# Patient Record
Sex: Female | Born: 1970 | Race: White | Hispanic: No | Marital: Married | Smoking: Never smoker
Health system: Southern US, Community
[De-identification: ages and names within clinical notes are randomized; demographics above are authoritative.]

## PROBLEM LIST (undated history)

## (undated) DIAGNOSIS — E785 Hyperlipidemia, unspecified: Secondary | ICD-10-CM

## (undated) DIAGNOSIS — E669 Obesity, unspecified: Secondary | ICD-10-CM

## (undated) DIAGNOSIS — M1990 Unspecified osteoarthritis, unspecified site: Secondary | ICD-10-CM

## (undated) DIAGNOSIS — I739 Peripheral vascular disease, unspecified: Secondary | ICD-10-CM

## (undated) DIAGNOSIS — F419 Anxiety disorder, unspecified: Secondary | ICD-10-CM

## (undated) HISTORY — DX: Obesity, unspecified: E66.9

## (undated) HISTORY — PX: COLONOSCOPY: SHX174

## (undated) HISTORY — DX: Unspecified osteoarthritis, unspecified site: M19.90

## (undated) HISTORY — DX: Anxiety disorder, unspecified: F41.9

## (undated) HISTORY — DX: Peripheral vascular disease, unspecified: I73.9

## (undated) HISTORY — PX: CHOLECYSTECTOMY: SHX55

---

## 1999-04-22 HISTORY — PX: CHOLECYSTECTOMY: SHX55

## 2001-04-19 ENCOUNTER — Encounter: Payer: Self-pay | Admitting: Emergency Medicine

## 2001-04-19 ENCOUNTER — Emergency Department (HOSPITAL_COMMUNITY): Admission: EM | Admit: 2001-04-19 | Discharge: 2001-04-19 | Payer: Self-pay | Admitting: Emergency Medicine

## 2006-05-07 ENCOUNTER — Ambulatory Visit: Payer: Self-pay

## 2007-04-08 ENCOUNTER — Other Ambulatory Visit: Admission: RE | Admit: 2007-04-08 | Discharge: 2007-04-08 | Payer: Self-pay | Admitting: Gynecology

## 2007-05-04 ENCOUNTER — Ambulatory Visit (HOSPITAL_COMMUNITY): Admission: RE | Admit: 2007-05-04 | Discharge: 2007-05-04 | Payer: Self-pay | Admitting: Gynecology

## 2008-01-12 ENCOUNTER — Ambulatory Visit (HOSPITAL_COMMUNITY): Admission: RE | Admit: 2008-01-12 | Discharge: 2008-01-12 | Payer: Self-pay | Admitting: Obstetrics and Gynecology

## 2008-05-24 ENCOUNTER — Inpatient Hospital Stay (HOSPITAL_COMMUNITY): Admission: AD | Admit: 2008-05-24 | Discharge: 2008-05-24 | Payer: Self-pay | Admitting: Obstetrics and Gynecology

## 2008-05-26 ENCOUNTER — Ambulatory Visit: Payer: Self-pay | Admitting: Obstetrics & Gynecology

## 2008-05-26 ENCOUNTER — Inpatient Hospital Stay (HOSPITAL_COMMUNITY): Admission: AD | Admit: 2008-05-26 | Discharge: 2008-06-01 | Payer: Self-pay | Admitting: Obstetrics & Gynecology

## 2008-06-02 ENCOUNTER — Inpatient Hospital Stay (HOSPITAL_COMMUNITY): Admission: AD | Admit: 2008-06-02 | Discharge: 2008-06-02 | Payer: Self-pay | Admitting: Obstetrics & Gynecology

## 2009-05-30 ENCOUNTER — Ambulatory Visit (HOSPITAL_COMMUNITY): Admission: RE | Admit: 2009-05-30 | Discharge: 2009-05-30 | Payer: Self-pay | Admitting: Obstetrics and Gynecology

## 2009-09-25 ENCOUNTER — Inpatient Hospital Stay (HOSPITAL_COMMUNITY): Admission: RE | Admit: 2009-09-25 | Discharge: 2009-09-28 | Payer: Self-pay | Admitting: Obstetrics & Gynecology

## 2009-09-25 ENCOUNTER — Encounter (INDEPENDENT_AMBULATORY_CARE_PROVIDER_SITE_OTHER): Payer: Self-pay | Admitting: Obstetrics & Gynecology

## 2009-10-29 ENCOUNTER — Encounter: Admission: RE | Admit: 2009-10-29 | Discharge: 2009-10-29 | Payer: Self-pay | Admitting: Obstetrics & Gynecology

## 2010-07-08 LAB — CCBB MATERNAL DONOR DRAW

## 2010-07-08 LAB — CBC
HCT: 38.3 % (ref 36.0–46.0)
MCHC: 35.2 g/dL (ref 30.0–36.0)
MCHC: 35.3 g/dL (ref 30.0–36.0)
Platelets: 187 10*3/uL (ref 150–400)
Platelets: 214 10*3/uL (ref 150–400)
RBC: 3.72 MIL/uL — ABNORMAL LOW (ref 3.87–5.11)
RDW: 14 % (ref 11.5–15.5)

## 2010-08-06 LAB — COMPREHENSIVE METABOLIC PANEL
ALT: 19 U/L (ref 0–35)
AST: 21 U/L (ref 0–37)
AST: 22 U/L (ref 0–37)
AST: 25 U/L (ref 0–37)
AST: 26 U/L (ref 0–37)
Albumin: 2.3 g/dL — ABNORMAL LOW (ref 3.5–5.2)
Albumin: 2.3 g/dL — ABNORMAL LOW (ref 3.5–5.2)
Albumin: 2.3 g/dL — ABNORMAL LOW (ref 3.5–5.2)
Albumin: 2.4 g/dL — ABNORMAL LOW (ref 3.5–5.2)
Albumin: 2.8 g/dL — ABNORMAL LOW (ref 3.5–5.2)
Albumin: 2.8 g/dL — ABNORMAL LOW (ref 3.5–5.2)
Alkaline Phosphatase: 119 U/L — ABNORMAL HIGH (ref 39–117)
Alkaline Phosphatase: 86 U/L (ref 39–117)
Alkaline Phosphatase: 86 U/L (ref 39–117)
BUN: 11 mg/dL (ref 6–23)
BUN: 14 mg/dL (ref 6–23)
BUN: 9 mg/dL (ref 6–23)
BUN: 9 mg/dL (ref 6–23)
CO2: 25 mEq/L (ref 19–32)
CO2: 26 mEq/L (ref 19–32)
Calcium: 9 mg/dL (ref 8.4–10.5)
Chloride: 104 mEq/L (ref 96–112)
Chloride: 104 mEq/L (ref 96–112)
Chloride: 105 mEq/L (ref 96–112)
Chloride: 107 mEq/L (ref 96–112)
Chloride: 107 mEq/L (ref 96–112)
Creatinine, Ser: 1.28 mg/dL — ABNORMAL HIGH (ref 0.4–1.2)
Creatinine, Ser: 1.36 mg/dL — ABNORMAL HIGH (ref 0.4–1.2)
GFR calc Af Amer: 55 mL/min — ABNORMAL LOW (ref >60)
GFR calc Af Amer: 57 mL/min — ABNORMAL LOW (ref >60)
GFR calc Af Amer: >60 mL/min (ref >60)
GFR calc non Af Amer: 44 mL/min — ABNORMAL LOW (ref >60)
GFR calc non Af Amer: 46 mL/min — ABNORMAL LOW (ref >60)
Glucose, Bld: 103 mg/dL — ABNORMAL HIGH (ref 70–99)
Glucose, Bld: 88 mg/dL (ref 70–99)
Potassium: 3.8 mEq/L (ref 3.5–5.1)
Potassium: 3.8 mEq/L (ref 3.5–5.1)
Potassium: 4 mEq/L (ref 3.5–5.1)
Potassium: 4 mEq/L (ref 3.5–5.1)
Potassium: 4.1 mEq/L (ref 3.5–5.1)
Sodium: 134 mEq/L — ABNORMAL LOW (ref 135–145)
Sodium: 138 mEq/L (ref 135–145)
Total Bilirubin: 0.2 mg/dL — ABNORMAL LOW (ref 0.3–1.2)
Total Bilirubin: 0.4 mg/dL (ref 0.3–1.2)
Total Bilirubin: 0.4 mg/dL (ref 0.3–1.2)
Total Bilirubin: 0.4 mg/dL (ref 0.3–1.2)
Total Bilirubin: 0.4 mg/dL (ref 0.3–1.2)
Total Protein: 5.5 g/dL — ABNORMAL LOW (ref 6.0–8.3)
Total Protein: 6.2 g/dL (ref 6.0–8.3)
Total Protein: 6.3 g/dL (ref 6.0–8.3)

## 2010-08-06 LAB — CBC
HCT: 30.4 % — ABNORMAL LOW (ref 36.0–46.0)
HCT: 38.3 % (ref 36.0–46.0)
HCT: 39.7 % (ref 36.0–46.0)
Hemoglobin: 11 g/dL — ABNORMAL LOW (ref 12.0–15.0)
Hemoglobin: 12.8 g/dL (ref 12.0–15.0)
Hemoglobin: 12.9 g/dL (ref 12.0–15.0)
MCHC: 33.7 g/dL (ref 30.0–36.0)
MCHC: 33.8 g/dL (ref 30.0–36.0)
MCHC: 34.4 g/dL (ref 30.0–36.0)
MCV: 87.1 fL (ref 78.0–100.0)
MCV: 88.2 fL (ref 78.0–100.0)
Platelets: 238 10*3/uL (ref 150–400)
Platelets: 243 10*3/uL (ref 150–400)
Platelets: 265 10*3/uL (ref 150–400)
Platelets: 266 10*3/uL (ref 150–400)
Platelets: 288 10*3/uL (ref 150–400)
RBC: 3.69 MIL/uL — ABNORMAL LOW (ref 3.87–5.11)
RBC: 4.38 MIL/uL (ref 3.87–5.11)
RDW: 14.4 % (ref 11.5–15.5)
RDW: 14.7 % (ref 11.5–15.5)
WBC: 10.3 10*3/uL (ref 4.0–10.5)
WBC: 10.6 10*3/uL — ABNORMAL HIGH (ref 4.0–10.5)
WBC: 10.8 10*3/uL — ABNORMAL HIGH (ref 4.0–10.5)
WBC: 10.8 10*3/uL — ABNORMAL HIGH (ref 4.0–10.5)
WBC: 12 10*3/uL — ABNORMAL HIGH (ref 4.0–10.5)
WBC: 14.7 10*3/uL — ABNORMAL HIGH (ref 4.0–10.5)

## 2010-08-06 LAB — BASIC METABOLIC PANEL
BUN: 10 mg/dL (ref 6–23)
CO2: 26 mEq/L (ref 19–32)
CO2: 26 mEq/L (ref 19–32)
Chloride: 103 mEq/L (ref 96–112)
Chloride: 104 mEq/L (ref 96–112)
Creatinine, Ser: 0.77 mg/dL (ref 0.4–1.2)
Creatinine, Ser: 1.15 mg/dL (ref 0.4–1.2)
GFR calc Af Amer: >60 mL/min (ref >60)
Potassium: 4.1 mEq/L (ref 3.5–5.1)
Potassium: 4.5 mEq/L (ref 3.5–5.1)
Sodium: 133 mEq/L — ABNORMAL LOW (ref 135–145)

## 2010-08-06 LAB — URINE MICROSCOPIC-ADD ON

## 2010-08-06 LAB — DIFFERENTIAL
Basophils Absolute: 0 10*3/uL (ref 0.0–0.1)
Basophils Relative: 0 % (ref 0–1)
Eosinophils Absolute: 0.2 10*3/uL (ref 0.0–0.7)
Eosinophils Relative: 2 % (ref 0–5)

## 2010-08-06 LAB — URINALYSIS, ROUTINE W REFLEX MICROSCOPIC
Bilirubin Urine: NEGATIVE
Glucose, UA: NEGATIVE mg/dL
Ketones, ur: NEGATIVE mg/dL
Ketones, ur: NEGATIVE mg/dL
Leukocytes, UA: NEGATIVE
Nitrite: NEGATIVE
Nitrite: NEGATIVE
Specific Gravity, Urine: 1.015 (ref 1.005–1.030)
Specific Gravity, Urine: 1.02 (ref 1.005–1.030)
Specific Gravity, Urine: >1.03 — ABNORMAL HIGH (ref 1.005–1.030)
Urobilinogen, UA: 0.2 mg/dL (ref 0.0–1.0)
Urobilinogen, UA: 0.2 mg/dL (ref 0.0–1.0)
pH: 6 (ref 5.0–8.0)
pH: 6.5 (ref 5.0–8.0)
pH: 6.5 (ref 5.0–8.0)

## 2010-08-06 LAB — URIC ACID
Uric Acid, Serum: 6 mg/dL (ref 2.4–7.0)
Uric Acid, Serum: 7.9 mg/dL — ABNORMAL HIGH (ref 2.4–7.0)

## 2010-08-06 LAB — LACTATE DEHYDROGENASE
LDH: 152 U/L (ref 94–250)
LDH: 234 U/L (ref 94–250)

## 2010-08-06 LAB — CCBB MATERNAL DONOR DRAW

## 2010-08-06 LAB — RPR: RPR Ser Ql: NONREACTIVE

## 2010-09-03 NOTE — Op Note (Signed)
Donna Bradley, WILTON               ACCOUNT NO.:  [id number]   MEDICAL RECORD NO.:  [id number]          PATIENT TYPE:  INP   LOCATION:  9102                          FACILITY:  [facility]   PHYSICIAN:  Ilda Mori, M.D.   DATE OF BIRTH:  May 17, 1970   DATE OF PROCEDURE:  05/27/2008  DATE OF DISCHARGE:                               OPERATIVE REPORT   PREOPERATIVE DIAGNOSIS:  Failure to progress.   POSTOPERATIVE DIAGNOSIS:  Failure to progress, occiput posterior.   PROCEDURE:  Primary low transverse cesarean section.   SURGEON:  Ilda Mori, MD   ASSISTANT:  Allie Bossier, MD   ANESTHESIA:  Epidural.   ESTIMATED BLOOD LOSS:  1000 mL.   FINDINGS:  Female infant 8 pounds 3 ounces with Apgar scores 8 and 9,  and meconium stained amniotic fluid.  Normal-appearing tubes, ovaries,  and uterus.   INDICATIONS:  This is a 40 year old primigravida female who presented in  early labor at 51 weeks' gestation.  The patient had borderline  hypertension and decision was made to admit the patient and augment her  labor as necessary.  Artificial rupture of membranes was carried out at  approximately 2200 hours on May 26, 2008 and moderate meconium  stained fluid was noted.  The patient received one single dose of 25 mcg  of Cytotec per vagina approximately 6 hours earlier.  She was having  contractions at that time, although was hard to determine these were  adequate.  Decision was made to allow her to progress through the latent  phase without stimulation due to a fetal tachycardia that was noted to  the 160-170 range and meconium fluid.  The tracing was otherwise very  reassuring with excellent reactivity.  IV Pitocin was begun at  approximately 4 a.m. with a cervix 1-cm dilated and continued per  protocol.  The patient did progress to 3 cm at approximately 12 noon on  May 27, 2008.  This was after an epidural and then placed.  The  patient at that point was on Pitocin with a [place]  units between 170  and 200.  The patient was observed for another 2 hours, at which point  it was clear; the cervix was not changing; and in light of the meconium-  stained fluid; the borderline tachycardia; and the poor progress in  delayed latent phase of labor; decision was made to proceed with  cesarean section.   PROCEDURE:  The patient was taken to the operating room and placed in  the supine position with left lateral space for uterus and the epidural  was reinjected for surgical anesthesia.  The abdomen was prepped and  draped in the sterile fashion.  A low transverse incision was made and  carried down to the fascia which was then extended transversely.  The  rectus sheath was then dissected free from the underlying rectus muscle  and the rectus muscle was separated bluntly.  The perineum was entered  and this was also separated bluntly.  A Alexis retractor was placed.  The lower segment was identified.  Incision was made and carried down  to  the endometrial cavity.  The infant was in the occiput posterior  position with a cheek up against the lower segment.  The vertex was then  rotated into the lower segment and delivered without difficulty and the  cord was cut and then the infant was given to the pediatricians.  The  naso and oropharynx were suctioned on the operative table.  The placenta  was then delivered with gentle traction on the cord and uterine was  massaged and given for cord blood collection.  The lower segment bluntly  curettaged to remove any remaining membranes.  The lower segment was  then closed with two layers of #1 Vicryl suture.  The first with a  running interlocking layer, the second was a running imbricating layer.  Hemostasis was noted be present.  The adnexa was visualized bilaterally  and was perfectly normal.  At this point, the rectus muscle and  peritoneum were closed with a 3-0 Vicryl suture.  The fascia was closed  with a running 0-Vicryl suture  and the skin was closed with staples.  The patient tolerated the procedure well and left the operating room in  good condition.      Ilda Mori, M.D.  Electronically Signed     RK/MEDQ  D:  05/27/2008  T:  05/27/2008  Job:  [id number]

## 2010-09-06 NOTE — Discharge Summary (Signed)
Donna Bradley, Donna Bradley               ACCOUNT NO.:  [id number]   MEDICAL RECORD NO.:  [id number]          PATIENT TYPE:  INP   LOCATION:  9102                          FACILITY:  [facility]   PHYSICIAN:  Kendra H. Tenny Craw, MD     DATE OF BIRTH:  1970-07-10   DATE OF ADMISSION:  05/26/2008  DATE OF DISCHARGE:  06/01/2008                               DISCHARGE SUMMARY   FINAL DIAGNOSES:  Intrauterine pregnancy at 10 weeks' gestation, active  labor, borderline mild pregnancy-induced hypertension, failure to  progress with occiput posterior presentation.   PROCEDURE:  Primary low transverse cesarean section.   SURGEON:  Ilda Mori, MD   ASSISTANT:  Myra C. Marice Potter, MD   COMPLICATIONS:  None.   This 40 year old G1, P0 presents at 47 weeks' gestation in early labor.  The patient did have some mild pregnancy-induced high blood pressure and  a decision was made to admit the patient and help augment her labor.  The patient's antepartum course up to this point had been complicated by  advanced maternal age.  The patient declined genetic screening with this  pregnancy.  She also has hypothyroidism and was on Levoxyl throughout  her pregnancy.  Her thyroid function tests were checked each trimester.  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  She did get pregnant with Clomid and IUI; otherwise, the  patient's antepartum course had been uncomplicated.  She did have a  negative group B strep culture obtained in our office at 36 weeks.  During the patient's hospital course, an AROM was performed and moderate  meconium-stained amniotic fluid noted.  The patient had received 1  single dose of Cytotec orally 6 hours earlier.  She was having some  contractions.  The patient was allowed to progress throughout the latent  phase of labor.  There was some mild tachycardia noted at 160-170.  Otherwise, the tracing was very reassuring.  IV Pitocin was begun at  4:00 a.m. and  continued per protocol.  The patient did dilate about 3 cm  dilation by 12 noon on May 27, 2008.  After this an epidural was  placed and the patient was observed for another 2 hours.  Cervix was not  changing in light of that as well as a meconium-stained amniotic fluid  was borderline tachycardia and poor progressive decision was made to  proceed with a cesarean section.  The patient was taken to the operating  room on May 27, 2008, by Dr. Ilda Mori where a primary low  transverse cesarean section was performed with the delivery of an 8  pounds 3 ounces female infant with Apgars of 8 and 9.  Delivery went  without complications.  The patient's postoperative course, the  patient's blood pressures remained stable.  Her creatinine was elevated.  The patient was on labetalol 200 mg t.i.d. for blood pressures,  ibuprofen was stopped and the patient was hydrated.  The patient was  given a dose of Lasix on postoperative day #4 secondary to some general  edema.  The patient's creatinine continued to rise.  The patient's  labetalol was increased to 400 mg twice daily on postoperative day #5  and her creatinine remained elevated and Nephrology consult was obtained  for assistance.  The case was discussed with Dr. Briant Cedar of [facility], by Dr. Waynard Reeds regarding this elevated  creatinine, and he felt the elevated creatinine reflected the patient's  hemoconcentration.  We recommended rechecking the patient on Monday and  would be as an outpatient.  The patient was felt ready for discharge on  postoperative day #5.  She was sent home on a regular diet, told to  decrease activities, told to continue her vitamins and her labetalol 400  mg b.i.d., was given Percocet 1-2 every 4-6 hours as needed for pain.  She was not using any ibuprofen, and she was to follow up on Monday for  a recheck of her creatinine.  Instructions and precautions were reviewed  with the  patient.   DISCHARGE LABORATORY DATA:  The patient had a hemoglobin of 10.5, white  blood cell count of 10.7, platelets of 266,000.  The patient did have an  elevated uric acid but normal liver function test and normal LDH.      Leilani Able, P.A.-C.      Freddrick March. Tenny Craw, MD  Electronically Signed    MB/MEDQ  D:  06/30/2008  T:  07/01/2008  Job:  [id number]

## 2011-02-03 ENCOUNTER — Emergency Department (HOSPITAL_COMMUNITY)
Admission: EM | Admit: 2011-02-03 | Discharge: 2011-02-03 | Disposition: A | Discharge status: Home / Self Care | Payer: Self-pay | Attending: Emergency Medicine | Admitting: Emergency Medicine

## 2011-02-03 DIAGNOSIS — F411 Generalized anxiety disorder: Secondary | ICD-10-CM | POA: Insufficient documentation

## 2011-02-03 DIAGNOSIS — E039 Hypothyroidism, unspecified: Secondary | ICD-10-CM | POA: Insufficient documentation

## 2011-02-03 DIAGNOSIS — I10 Essential (primary) hypertension: Secondary | ICD-10-CM | POA: Insufficient documentation

## 2011-02-03 DIAGNOSIS — M549 Dorsalgia, unspecified: Secondary | ICD-10-CM | POA: Insufficient documentation

## 2011-02-03 DIAGNOSIS — R209 Unspecified disturbances of skin sensation: Secondary | ICD-10-CM | POA: Insufficient documentation

## 2011-02-03 DIAGNOSIS — R51 Headache: Secondary | ICD-10-CM | POA: Insufficient documentation

## 2011-02-03 LAB — POCT I-STAT, CHEM 8
BUN: 15 mg/dL (ref 6–23)
Calcium, Ion: 1.24 mmol/L (ref 1.12–1.32)
Creatinine, Ser: 0.6 mg/dL (ref 0.50–1.10)
Glucose, Bld: 122 mg/dL — ABNORMAL HIGH (ref 70–99)
Hemoglobin: 14.3 g/dL (ref 12.0–15.0)
Sodium: 137 mEq/L (ref 135–145)
TCO2: 26 mmol/L (ref 0–100)

## 2011-02-03 LAB — CBC
Hemoglobin: 14.3 g/dL (ref 12.0–15.0)
MCH: 29.1 pg (ref 26.0–34.0)
MCHC: 35 g/dL (ref 30.0–36.0)
MCV: 83.3 fL (ref 78.0–100.0)
RBC: 4.91 MIL/uL (ref 3.87–5.11)

## 2011-02-03 LAB — DIFFERENTIAL
Basophils Relative: 0 % (ref 0–1)
Eosinophils Absolute: 0.1 10*3/uL (ref 0.0–0.7)
Lymphs Abs: 2.3 10*3/uL (ref 0.7–4.0)
Monocytes Absolute: 0.7 10*3/uL (ref 0.1–1.0)
Monocytes Relative: 8 % (ref 3–12)
Neutro Abs: 5.8 10*3/uL (ref 1.7–7.7)

## 2012-05-19 ENCOUNTER — Encounter (INDEPENDENT_AMBULATORY_CARE_PROVIDER_SITE_OTHER): Payer: Commercial Managed Care - PPO | Admitting: *Deleted

## 2012-05-19 DIAGNOSIS — I70219 Atherosclerosis of native arteries of extremities with intermittent claudication, unspecified extremity: Secondary | ICD-10-CM

## 2012-05-31 ENCOUNTER — Encounter (HOSPITAL_COMMUNITY): Payer: Self-pay | Admitting: Emergency Medicine

## 2012-05-31 ENCOUNTER — Emergency Department (HOSPITAL_COMMUNITY): Payer: Commercial Managed Care - PPO

## 2012-05-31 ENCOUNTER — Emergency Department (HOSPITAL_COMMUNITY)
Admission: EM | Admit: 2012-05-31 | Discharge: 2012-05-31 | Disposition: A | Discharge status: Home / Self Care | Payer: Commercial Managed Care - PPO | Attending: Emergency Medicine | Admitting: Emergency Medicine

## 2012-05-31 DIAGNOSIS — R0602 Shortness of breath: Secondary | ICD-10-CM | POA: Insufficient documentation

## 2012-05-31 DIAGNOSIS — R0789 Other chest pain: Secondary | ICD-10-CM | POA: Insufficient documentation

## 2012-05-31 DIAGNOSIS — R079 Chest pain, unspecified: Secondary | ICD-10-CM

## 2012-05-31 DIAGNOSIS — Z79899 Other long term (current) drug therapy: Secondary | ICD-10-CM | POA: Insufficient documentation

## 2012-05-31 DIAGNOSIS — Z8639 Personal history of other endocrine, nutritional and metabolic disease: Secondary | ICD-10-CM | POA: Insufficient documentation

## 2012-05-31 DIAGNOSIS — Z862 Personal history of diseases of the blood and blood-forming organs and certain disorders involving the immune mechanism: Secondary | ICD-10-CM | POA: Insufficient documentation

## 2012-05-31 DIAGNOSIS — F411 Generalized anxiety disorder: Secondary | ICD-10-CM | POA: Insufficient documentation

## 2012-05-31 DIAGNOSIS — Z791 Long term (current) use of non-steroidal anti-inflammatories (NSAID): Secondary | ICD-10-CM | POA: Insufficient documentation

## 2012-05-31 HISTORY — DX: Hyperlipidemia, unspecified: E78.5

## 2012-05-31 LAB — POCT I-STAT TROPONIN I: Troponin i, poc: 0 ng/mL (ref 0.00–0.08)

## 2012-05-31 LAB — COMPREHENSIVE METABOLIC PANEL
ALT: 31 U/L (ref 0–35)
Albumin: 4 g/dL (ref 3.5–5.2)
Alkaline Phosphatase: 57 U/L (ref 39–117)
Calcium: 9.5 mg/dL (ref 8.4–10.5)
GFR calc Af Amer: >90 mL/min (ref >90)
Glucose, Bld: 97 mg/dL (ref 70–99)
Potassium: 4.4 mEq/L (ref 3.5–5.1)
Sodium: 139 mEq/L (ref 135–145)
Total Protein: 7.1 g/dL (ref 6.0–8.3)

## 2012-05-31 LAB — CBC WITH DIFFERENTIAL/PLATELET
Basophils Absolute: 0 10*3/uL (ref 0.0–0.1)
Basophils Relative: 0 % (ref 0–1)
Eosinophils Absolute: 0.1 10*3/uL (ref 0.0–0.7)
Eosinophils Relative: 1 % (ref 0–5)
Lymphs Abs: 3.9 10*3/uL (ref 0.7–4.0)
MCH: 29.2 pg (ref 26.0–34.0)
MCHC: 34.8 g/dL (ref 30.0–36.0)
MCV: 83.9 fL (ref 78.0–100.0)
Neutrophils Relative %: 59 % (ref 43–77)
Platelets: 305 10*3/uL (ref 150–400)
RBC: 5.17 MIL/uL — ABNORMAL HIGH (ref 3.87–5.11)
RDW: 13 % (ref 11.5–15.5)

## 2012-05-31 MED ORDER — ACETAMINOPHEN 325 MG PO TABS
650.0000 mg | ORAL_TABLET | Freq: Once | ORAL | Status: AC
  Administered 2012-05-31: 650 mg via ORAL
  Filled 2012-05-31: qty 2

## 2012-05-31 NOTE — ED Provider Notes (Signed)
Medical screening examination/treatment/procedure(s) were performed by non-physician practitioner and as supervising physician I was immediately available for consultation/collaboration.   Loren Racer, MD 05/31/12 [phone]

## 2012-05-31 NOTE — ED Notes (Signed)
Pt to and from x-ray

## 2012-05-31 NOTE — ED Provider Notes (Signed)
History     CSN: [id number]  Arrival date & time 05/31/12  1926   First MD Initiated Contact with Patient 05/31/12 2058      Chief Complaint  Patient presents with  . Chest Pain    (Consider location/radiation/quality/duration/timing/severity/associated sxs/prior treatment) HPI  42 year old female presents complaining of chest pain. Patient reports for the past 2 weeks she has experiencing intermittent chest discomfort. Described as a achy and sharp sensation, radiates up to her right side of her neck, with occasional shortness of breath. Symptoms worsen at nighttime, with associate anxiety. No exertional symptoms. Patient was seen by her PCP for her complaint, her initial blood work was unremarkable, and patient was diagnosed with anxiety. She was given antianxiety medication which she has been taking. States it has helped with the sense of impending doom which she has before. However continues to endorse chest discomfort. She rates the pain as a 5/10, across the top of the chest, occasionally radiates to the right side of the neck. Pain is worsened when she turns her neck. She has taken ibuprofen last night which provide some relief. She denies fever, chills, sneezing, cough, productive cough, moderate assist, nausea, vomiting, diarrhea, abdominal pain, back pain, or rash. She denies prior history of PE or DVT. Denies taking birth control pill, having recent surgery, prolonged bed rest, or other risk factors for blood clot. Denies family history of premature cardiac death. No history of diabetes, hypertension, nonsmoker.   Past Medical History  Diagnosis Date  . Borderline hyperlipidemia     Past Surgical History  Procedure Laterality Date  . Cholecystectomy    . Cesarean section      History reviewed. No pertinent family history.  History  Substance Use Topics  . Smoking status: Never Smoker   . Smokeless tobacco: Not on file  . Alcohol Use: Yes     Comment: Rarely    OB  History   Grav Para Term Preterm Abortions TAB SAB Ect Mult Living                  Review of Systems  Constitutional:       10 Systems reviewed and all are negative for acute change except as noted in the HPI.     Allergies  Review of patient's allergies indicates no known allergies.  Home Medications  No current outpatient prescriptions on file.  BP 131/88  Pulse 75  Temp(Src) 98.6 F (37 C) (Oral)  Resp 20  SpO2 98%  LMP 05/26/2012  Physical Exam  Nursing note and vitals reviewed. Constitutional: She is oriented to person, place, and time. She appears well-developed and well-nourished. No distress.  Awake, alert, nontoxic appearance  HENT:  Head: Normocephalic and atraumatic.  Right Ear: External ear normal.  Left Ear: External ear normal.  Nose: Nose normal.  Mouth/Throat: Oropharynx is clear and moist. No oropharyngeal exudate.  Eyes: Conjunctivae are normal. Right eye exhibits no discharge. Left eye exhibits no discharge.  Neck: Normal range of motion. Neck supple.  Mild tenderness along anterior SCM on R side of neck, worse with lateral neck rotation. No overlying skin changes.  No lymphadenopathy, no abscess.  No thyromegaly, or goiter noted.    No nuchal rigidity  Cardiovascular: Normal rate and regular rhythm.   Pulmonary/Chest: Effort normal. No respiratory distress. She exhibits no tenderness.  Abdominal: Soft. There is no tenderness. There is no rebound.  Musculoskeletal: She exhibits no edema and no tenderness.  ROM appears intact, no obvious focal  weakness  Lymphadenopathy:    She has no cervical adenopathy.  Neurological: She is alert and oriented to person, place, and time.  Mental status and motor strength appears intact  Skin: No rash noted.  Psychiatric: She has a normal mood and affect.    ED Course  Procedures (including critical care time)   Date: 05/31/2012  Rate: 75  Rhythm: normal sinus rhythm  QRS Axis: normal  Intervals:  normal  ST/T Wave abnormalities: normal  Conduction Disutrbances: none  Narrative Interpretation:   Old EKG Reviewed: none for comparison     Labs Reviewed  CBC WITH DIFFERENTIAL - Abnormal; Notable for the following:    WBC 11.5 (*)    RBC 5.17 (*)    Hemoglobin 15.1 (*)    All other components within normal limits  COMPREHENSIVE METABOLIC PANEL  POCT I-STAT TROPONIN I   Dg Chest 2 View  05/31/2012  *RADIOLOGY REPORT*  Clinical Data: Chest pain radiating into the right neck.  Shortness of breath.  CHEST - 2 VIEW  Comparison: None.  Findings: Mild airway thickening noted. Cardiac and mediastinal contours appear unremarkable.  No airspace opacity or pleural effusion noted.  IMPRESSION:  1. Airway thickening may reflect bronchitis or reactive airways disease.  No airspace opacity is identified to suggest bacterial pneumonia pattern.   Original Report Authenticated By: Gaylyn Rong, M.D.    Results for orders placed during the hospital encounter of 05/31/12  CBC WITH DIFFERENTIAL      Result Value Range   WBC 11.5 (*) 4.0 - 10.5 K/uL   RBC 5.17 (*) 3.87 - 5.11 MIL/uL   Hemoglobin 15.1 (*) 12.0 - 15.0 g/dL   HCT 16.1  09.6 - 04.5 %   MCV 83.9  78.0 - 100.0 fL   MCH 29.2  26.0 - 34.0 pg   MCHC 34.8  30.0 - 36.0 g/dL   RDW 40.9  81.1 - 91.4 %   Platelets 305  150 - 400 K/uL   Neutrophils Relative 59  43 - 77 %   Neutro Abs 6.8  1.7 - 7.7 K/uL   Lymphocytes Relative 34  12 - 46 %   Lymphs Abs 3.9  0.7 - 4.0 K/uL   Monocytes Relative 6  3 - 12 %   Monocytes Absolute 0.7  0.1 - 1.0 K/uL   Eosinophils Relative 1  0 - 5 %   Eosinophils Absolute 0.1  0.0 - 0.7 K/uL   Basophils Relative 0  0 - 1 %   Basophils Absolute 0.0  0.0 - 0.1 K/uL  COMPREHENSIVE METABOLIC PANEL      Result Value Range   Sodium 139  135 - 145 mEq/L   Potassium 4.4  3.5 - 5.1 mEq/L   Chloride 104  96 - 112 mEq/L   CO2 27  19 - 32 mEq/L   Glucose, Bld 97  70 - 99 mg/dL   BUN 15  6 - 23 mg/dL    Creatinine, Ser 7.82  0.50 - 1.10 mg/dL   Calcium 9.5  8.4 - 95.6 mg/dL   Total Protein 7.1  6.0 - 8.3 g/dL   Albumin 4.0  3.5 - 5.2 g/dL   AST 20  0 - 37 U/L   ALT 31  0 - 35 U/L   Alkaline Phosphatase 57  39 - 117 U/L   Total Bilirubin 0.4  0.3 - 1.2 mg/dL   GFR calc non Af Amer >90  >90 mL/min   GFR calc Af  Amer >90  >90 mL/min  POCT I-STAT TROPONIN I      Result Value Range   Troponin i, poc 0.00  0.00 - 0.08 ng/mL   Comment 3            Dg Chest 2 View  05/31/2012  *RADIOLOGY REPORT*  Clinical Data: Chest pain radiating into the right neck.  Shortness of breath.  CHEST - 2 VIEW  Comparison: None.  Findings: Mild airway thickening noted. Cardiac and mediastinal contours appear unremarkable.  No airspace opacity or pleural effusion noted.  IMPRESSION:  1. Airway thickening may reflect bronchitis or reactive airways disease.  No airspace opacity is identified to suggest bacterial pneumonia pattern.   Original Report Authenticated By: Gaylyn Rong, M.D.       No diagnosis found.  9:22 PM  patient was seen and evaluated by me for her chest discomfort. Pain appears to be atypical for cardiac disease, especially considering this has been ongoing x 2 weeks. She has no risk factor concerning for PE. Her pain on the right side of neck is reproducible on exam but there are no evidence of infections or torticolis.    Her work up is essentially unremarkable.  Normal ECG, normal trop, H&H and electrolytes are unremarkable.  CXR shows no evidence of PNA.  There is airway thickening which may reflect bronchitis.  However pt denies coughing or having URI sxs.  Will have pt f/u with PCP for further care.  Pt appears anxious, but VSS, afebrile, normal heart rate.  Pt has Klonopin available which she can take once discharged.  Return precaution discussed.    BP 131/88  Pulse 75  Temp(Src) 98.6 F (37 C) (Oral)  Resp 20  SpO2 98%  LMP 05/26/2012  I have reviewed nursing notes and vital  signs. I personally reviewed the imaging tests through PACS system  I reviewed available ER/hospitalization records thought the EMR  1. Chest pain   MDM          Fayrene Helper, PA-C 05/31/12 2217  Fayrene Helper, PA-C 05/31/12 2220

## 2012-05-31 NOTE — ED Notes (Signed)
Patient reports that she has had chest pains/palpitations for the past two weeks; patient was seen by her PCP and diagnosed with anxiety; patient given anxiety medications.  Patient reports that her anxiety is gone, but the pain still remains.  Starting today, pain radiating into neck and shoulders.  Denies shortness of breath, nausea, vomiting, and diaphoresis.  Denies cardiac history.

## 2013-02-21 ENCOUNTER — Institutional Professional Consult (permissible substitution): Payer: Commercial Managed Care - PPO | Admitting: Pulmonary Disease

## 2013-03-25 ENCOUNTER — Encounter: Payer: Self-pay | Admitting: Pulmonary Disease

## 2013-03-28 ENCOUNTER — Encounter: Payer: Self-pay | Admitting: Pulmonary Disease

## 2013-03-28 ENCOUNTER — Ambulatory Visit (INDEPENDENT_AMBULATORY_CARE_PROVIDER_SITE_OTHER): Payer: Commercial Managed Care - PPO | Admitting: Pulmonary Disease

## 2013-03-28 ENCOUNTER — Other Ambulatory Visit: Payer: Self-pay | Admitting: Obstetrics and Gynecology

## 2013-03-28 VITALS — BP 122/82 | HR 81 | Temp 98.6°F | Ht 66.0 in | Wt 254.6 lb

## 2013-03-28 DIAGNOSIS — G4733 Obstructive sleep apnea (adult) (pediatric): Secondary | ICD-10-CM

## 2013-03-28 NOTE — Patient Instructions (Signed)
Will schedule for home sleep testing, and arrange followup once the results are available. 

## 2013-03-28 NOTE — Assessment & Plan Note (Signed)
The patient's history is very suggestive of clinically significant sleep apnea. It had a long discussion with her about sleep apnea, including its impact to her quality of life and cardiovascular health. I think she needs to have a sleep study for diagnosis, and she is an excellent candidate for home sleep testing. The patient is agreeable to this approach.

## 2013-03-28 NOTE — Progress Notes (Signed)
Subjective:    Patient ID: Donna Bradley, female    DOB: 1970-04-28, 42 y.o.   MRN: [id number]  HPI The patient is a 42 year old female who I've been asked to see for possible obstructive sleep apnea. She has been noted to have loud snoring as well as an abnormal breathing pattern during sleep by her husband. She has also had occasional choking arousals during the night. She has frequent awakenings, and is not rested in the mornings upon arising. She notes definite sleepiness during the day if she is an active, and will fall asleep easily in the evenings watching television or movies. She does not have issues with driving unless its early morning or late evening. The patient states that her weight has been neutral over the last few years, and her Epworth score today is 8.     Sleep Questionnaire What time do you typically go to bed?( Between what hours) 10-11p 10-11p at 1449 on 03/28/13 by Maisie Fus, CMA How long does it take you to fall asleep? 5-42min 5-15min at 1449 on 03/28/13 by Maisie Fus, CMA How many times during the night do you wake up? 44 2-4x at 1449 on 03/28/13 by Maisie Fus, CMA What time do you get out of bed to start your day? No Value 6-8am at 1449 on 03/28/13 by Maisie Fus, CMA Do you drive or operate heavy machinery in your occupation? NoNo part time job--drives local at 1449 on 03/28/13 by Maisie Fus, CMA How much has your weight changed (up or down) over the past two years? (In pounds) 0 oz (0 kg) 0 oz (0 kg) at 1449 on 03/28/13 by Maisie Fus, CMA Have you ever had a sleep study before? No No at 1449 on 03/28/13 by Maisie Fus, CMA Do you currently use CPAP? No No at 1449 on 03/28/13 by Maisie Fus, CMA Do you wear oxygen at any time? No No at 1449 on 03/28/13 by Maisie Fus, CMA   Review of Systems  Constitutional: Negative for fever and unexpected weight change.  HENT: Positive for dental problem and ear pain.  Negative for congestion, nosebleeds, postnasal drip, rhinorrhea, sinus pressure, sneezing, sore throat and trouble swallowing.   Eyes: Negative for redness and itching.  Respiratory: Positive for shortness of breath. Negative for cough, chest tightness and wheezing.   Cardiovascular: Negative for palpitations and leg swelling.  Gastrointestinal: Negative for nausea and vomiting.       Acid heartburn  Genitourinary: Negative for dysuria.  Musculoskeletal: Negative for joint swelling.  Skin: Negative for rash ( itching).  Neurological: Positive for headaches.  Hematological: Does not bruise/bleed easily.  Psychiatric/Behavioral: Negative for dysphoric mood. The patient is nervous/anxious.        Objective:   Physical Exam Constitutional:  Obese female, no acute distress  HENT:  Nares patent without discharge, mild septal deviation to the left with narrowing.   Oropharynx without exudate, palate and uvula are thickened and elongated.   Eyes:  Perrla, eomi, no scleral icterus  Neck:  No JVD, no TMG  Cardiovascular:  Normal rate, regular rhythm, no rubs or gallops.  No murmurs        Intact distal pulses  Pulmonary :  Normal breath sounds, no stridor or respiratory distress   No rales, rhonchi, or wheezing  Abdominal:  Soft, nondistended, bowel sounds present.  No tenderness noted.   Musculoskeletal:  No lower extremity edema noted.  Lymph Nodes:  No cervical lymphadenopathy noted  Skin:  No cyanosis noted  Neurologic:  Appears sleepy, but appropriate, moves all 4 extremities without obvious deficit.         Assessment & Plan:

## 2013-04-18 DIAGNOSIS — G4733 Obstructive sleep apnea (adult) (pediatric): Secondary | ICD-10-CM

## 2013-04-29 DIAGNOSIS — G4733 Obstructive sleep apnea (adult) (pediatric): Secondary | ICD-10-CM

## 2013-05-03 ENCOUNTER — Encounter: Payer: Self-pay | Admitting: Pulmonary Disease

## 2014-06-17 ENCOUNTER — Emergency Department (HOSPITAL_COMMUNITY)
Admission: EM | Admit: 2014-06-17 | Discharge: 2014-06-17 | Disposition: A | Discharge status: Home / Self Care | Payer: BLUE CROSS/BLUE SHIELD | Attending: Emergency Medicine | Admitting: Emergency Medicine

## 2014-06-17 ENCOUNTER — Emergency Department (HOSPITAL_COMMUNITY): Payer: BLUE CROSS/BLUE SHIELD

## 2014-06-17 ENCOUNTER — Encounter (HOSPITAL_COMMUNITY): Payer: Self-pay | Admitting: *Deleted

## 2014-06-17 DIAGNOSIS — Z79899 Other long term (current) drug therapy: Secondary | ICD-10-CM | POA: Insufficient documentation

## 2014-06-17 DIAGNOSIS — J159 Unspecified bacterial pneumonia: Secondary | ICD-10-CM | POA: Insufficient documentation

## 2014-06-17 DIAGNOSIS — F419 Anxiety disorder, unspecified: Secondary | ICD-10-CM | POA: Insufficient documentation

## 2014-06-17 DIAGNOSIS — Z8639 Personal history of other endocrine, nutritional and metabolic disease: Secondary | ICD-10-CM | POA: Diagnosis not present

## 2014-06-17 DIAGNOSIS — R51 Headache: Secondary | ICD-10-CM | POA: Diagnosis not present

## 2014-06-17 DIAGNOSIS — J189 Pneumonia, unspecified organism: Secondary | ICD-10-CM

## 2014-06-17 DIAGNOSIS — Z8679 Personal history of other diseases of the circulatory system: Secondary | ICD-10-CM | POA: Insufficient documentation

## 2014-06-17 DIAGNOSIS — R0981 Nasal congestion: Secondary | ICD-10-CM | POA: Diagnosis present

## 2014-06-17 LAB — CBG MONITORING, ED: GLUCOSE-CAPILLARY: 126 mg/dL — AB (ref 70–99)

## 2014-06-17 MED ORDER — IBUPROFEN 800 MG PO TABS: 800.0000 mg | ORAL_TABLET | Freq: Three times a day (TID) | ORAL | Status: DC

## 2014-06-17 MED ORDER — ONDANSETRON 4 MG PO TBDP: 4.0000 mg | ORAL_TABLET | Freq: Once | ORAL | Status: DC

## 2014-06-17 MED ORDER — AZITHROMYCIN 250 MG PO TABS: 250.0000 mg | ORAL_TABLET | Freq: Every day | ORAL | Status: DC

## 2014-06-17 MED ORDER — ALBUTEROL SULFATE HFA 108 (90 BASE) MCG/ACT IN AERS
2.0000 | INHALATION_SPRAY | Freq: Once | RESPIRATORY_TRACT | Status: AC
  Administered 2014-06-17: 2 via RESPIRATORY_TRACT
  Filled 2014-06-17: qty 6.7

## 2014-06-17 MED ORDER — HYDROCODONE-HOMATROPINE 5-1.5 MG/5ML PO SYRP: 5.0000 mL | ORAL_SOLUTION | Freq: Four times a day (QID) | ORAL | Status: DC | PRN

## 2014-06-17 MED ORDER — HYDROCODONE-HOMATROPINE 5-1.5 MG/5ML PO SYRP
5.0000 mL | ORAL_SOLUTION | Freq: Once | ORAL | Status: AC
  Administered 2014-06-17: 5 mL via ORAL
  Filled 2014-06-17: qty 5

## 2014-06-17 MED ORDER — ACETAMINOPHEN 500 MG PO TABS: 500.0000 mg | ORAL_TABLET | Freq: Four times a day (QID) | ORAL | Status: DC | PRN

## 2014-06-17 MED ORDER — ONDANSETRON 4 MG PO TBDP: 4.0000 mg | ORAL_TABLET | Freq: Three times a day (TID) | ORAL | Status: DC | PRN

## 2014-06-17 MED ORDER — ONDANSETRON 4 MG PO TBDP
4.0000 mg | ORAL_TABLET | Freq: Once | ORAL | Status: AC
  Administered 2014-06-17: 4 mg via ORAL
  Filled 2014-06-17: qty 1

## 2014-06-17 MED ORDER — IBUPROFEN 400 MG PO TABS
800.0000 mg | ORAL_TABLET | Freq: Once | ORAL | Status: AC
  Administered 2014-06-17: 800 mg via ORAL
  Filled 2014-06-17: qty 4

## 2014-06-17 NOTE — ED Notes (Signed)
Patient presents stating she has had a cough and congestion since Wed.  Noticed a fever today and took Thera flu today.

## 2014-06-17 NOTE — ED Notes (Signed)
Pt reports she feels much better, she doesn't feel like she is going to pass out at this time.  Color in face returned, pink and dry.  No nausea.  C/o headache and stomach cramping.  Anderson Malta, PA at bedside.

## 2014-06-17 NOTE — ED Notes (Signed)
Called into room by spouse.  Patient found unresponsive and pale.  Aroused quickly to stimulus. Physician at the bedside at this time.

## 2014-06-17 NOTE — ED Notes (Signed)
Patient transported to X-ray 

## 2014-06-17 NOTE — ED Provider Notes (Signed)
CSN: [id number]     Arrival date & time 06/17/14  2026 History   First MD Initiated Contact with Patient 06/17/14 2103     Chief Complaint  Patient presents with  . Nasal Congestion  . Fever     (Consider location/radiation/quality/duration/timing/severity/associated sxs/prior Treatment) HPI Comments: Patient is a 43 year old female presenting to the emergency department for evaluation of fever. She states she has had a nonproductive cough with nasal congestion since Wednesday. She knows she developed a fever today and then took TheraFlu for her symptoms. She states she has developed associated nausea, generalized throbbing headache with her symptoms. She states coughing worsens her headache. She states she is taking ibuprofen with little to no improvement of her symptoms. 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 She denies any visual disturbance, numbness, weakness, syncope, abdominal pain, diarrhea, rash.   Past Medical History  Diagnosis Date  . Borderline hyperlipidemia   . Anxiety   . Intermittent claudication    Past Surgical History  Procedure Laterality Date  . Cholecystectomy    . Cesarean section     Family History  Problem Relation Age of Onset  . Diabetes Brother   . Lung cancer Father   . Rheum arthritis Mother   . Heart disease Mother    History  Substance Use Topics  . Smoking status: Never Smoker   . Smokeless tobacco: Not on file  . Alcohol Use: Yes     Comment: Rarely   OB History    No data available     Review of Systems  Constitutional: Positive for fever and chills.  HENT: Positive for congestion.   Eyes: Negative for visual disturbance.  Respiratory: Positive for cough.   Gastrointestinal: Positive for nausea. Negative for vomiting, abdominal pain and diarrhea.  Neurological: Positive for headaches. Negative for dizziness, syncope, weakness and numbness.  All other systems reviewed and are negative.     Allergies   Review of patient's allergies indicates no known allergies.  Home Medications   Prior to Admission medications   Medication Sig Start Date End Date Taking? Authorizing Provider  acetaminophen (TYLENOL) 500 MG tablet Take 1 tablet (500 mg total) by mouth every 6 (six) hours as needed. 06/17/14   Talon Regala L Gailyn Crook, PA-C  azithromycin (ZITHROMAX) 250 MG tablet Take 1 tablet (250 mg total) by mouth daily. Take first 2 tablets together, then 1 every day until finished. 06/17/14   Truitt Cruey L Shontell Prosser, PA-C  clonazePAM (KLONOPIN) 0.5 MG tablet Take 0.5 mg by mouth 2 (two) times daily as needed for anxiety.    Historical Provider, MD  escitalopram (LEXAPRO) 10 MG tablet Take 10 mg by mouth daily.    Historical Provider, MD  HYDROcodone-homatropine (HYCODAN) 5-1.5 MG/5ML syrup Take 5 mLs by mouth every 6 (six) hours as needed for cough. 06/17/14   Jorge Retz L Alyssabeth Bruster, PA-C  ibuprofen (ADVIL,MOTRIN) 200 MG tablet Take 400 mg by mouth 3 (three) times daily as needed for pain.    Historical Provider, MD  ibuprofen (ADVIL,MOTRIN) 800 MG tablet Take 1 tablet (800 mg total) by mouth 3 (three) times daily. 06/17/14   Darrio Bade L Adolfo Granieri, PA-C  ondansetron (ZOFRAN ODT) 4 MG disintegrating tablet Take 1 tablet (4 mg total) by mouth every 8 (eight) hours as needed for nausea or vomiting. 06/17/14   Anderson Malta L Shaterria Sager, PA-C   BP 143/58 mmHg  Pulse 94  Temp(Src) 99.1 F (37.3 C) (Oral)  Resp 16  Ht 5\' 6"  (1.676 m)  Wt  245 lb (111.131 kg)  BMI 39.56 kg/m2  SpO2 95%  LMP 06/14/2014 Physical Exam  Constitutional: She is oriented to person, place, and time. She appears well-developed and well-nourished. No distress.  HENT:  Head: Normocephalic and atraumatic.  Right Ear: External ear normal.  Left Ear: External ear normal.  Nose: Nose normal.  Mouth/Throat: Oropharynx is clear and moist.  Eyes: Conjunctivae are normal.  Neck: Normal range of motion. Neck supple.  No nuchal rigidity   Cardiovascular: Normal rate, regular rhythm and normal heart sounds.   Pulmonary/Chest: Effort normal and breath sounds normal. No respiratory distress.  Cough appreciated on examination  Abdominal: Soft. Bowel sounds are normal. There is no tenderness.  Musculoskeletal: Normal range of motion.  Neurological: She is alert and oriented to person, place, and time. No cranial nerve deficit.  Moves all extremities without ataxia Sensation grossly intact.   Skin: Skin is warm and dry. She is not diaphoretic. No pallor.  Psychiatric: She has a normal mood and affect.  Nursing note and vitals reviewed.   ED Course  Procedures (including critical care time) Medications  ibuprofen (ADVIL,MOTRIN) tablet 800 mg (800 mg Oral Given 06/17/14 2159)  HYDROcodone-homatropine (HYCODAN) 5-1.5 MG/5ML syrup 5 mL (5 mLs Oral Given 06/17/14 2159)  albuterol (PROVENTIL HFA;VENTOLIN HFA) 108 (90 BASE) MCG/ACT inhaler 2 puff (2 puffs Inhalation Given 06/17/14 2159)  ondansetron (ZOFRAN-ODT) disintegrating tablet 4 mg (4 mg Oral Given 06/17/14 2222)    Labs Review Labs Reviewed  CBG MONITORING, ED - Abnormal; Notable for the following:    Glucose-Capillary 126 (*)    All other components within normal limits  INFLUENZA PANEL BY PCR (TYPE A & B, H1N1)    Imaging Review Dg Chest 2 View  06/17/2014   CLINICAL DATA:  Fever, cough  EXAM: CHEST  2 VIEW  COMPARISON:  05/31/2012  FINDINGS: Right upper lobe opacity, suspicious for pneumonia. Left lung is clear. No pleural effusion or pneumothorax.  The heart is normal in size.  Visualized osseous structures are within normal limits.  IMPRESSION: Right upper lobe opacity, suspicious for pneumonia.   Electronically Signed   By: Julian Hy M.D.   On: 06/17/2014 21:54     EKG Interpretation None      Patient synopsized immediately following influenza swab, she was pale easily arousable to stimulus per nurse. CBG checked 126. Patient given cool cloth and  gingerale, feeling better. Will slowly get patient up.   Evaluation patient able to sit, stand up and ambulate without further symptoms. MDM   Final diagnoses:  CAP (community acquired pneumonia)    Filed Vitals:   06/17/14 2310  BP: 143/58  Pulse: 94  Temp: 99.1 F (37.3 C)  Resp: 16   I have reviewed nursing notes, vital signs, and all appropriate lab and imaging results for this patient.   Patient has been diagnosed with CAP via chest xray. Pt is not ill appearing, immunocompromised, and does not have multiple co morbidities, therefore I feel like the they can be treated as an OP with abx therapy. Pt has been advised to return to the ED if symptoms worsen or they do not improve. Pt verbalizes understanding and is agreeable with plan. Patient is stable at time of discharge     Harlow Mares, PA-C 06/18/14 0502  Fredia Sorrow, MD 06/18/14 2300

## 2014-06-17 NOTE — Discharge Instructions (Signed)
Please follow up with your primary care physician in 1-2 days. If you do not have one please call the [facility] number listed above. Please take your antibiotic until completion. Please take pain medication and/or muscle relaxants as prescribed and as needed for pain. Please do not drive on narcotic pain medication or on muscle relaxants. Please alternate between Motrin and Tylenol every three hours for fevers and pain. Please read all discharge instructions and return precautions.   Pneumonia Pneumonia is an infection of the lungs.  CAUSES Pneumonia may be caused by bacteria or a virus. Usually, these infections are caused by breathing infectious particles into the lungs (respiratory tract). SIGNS AND SYMPTOMS   Cough.  Fever.  Chest pain.  Increased rate of breathing.  Wheezing.  Mucus production. DIAGNOSIS  If you have the common symptoms of pneumonia, your health care provider will typically confirm the diagnosis with a chest X-ray. The X-ray will show an abnormality in the lung (pulmonary infiltrate) if you have pneumonia. Other tests of your blood, urine, or sputum may be done to find the specific cause of your pneumonia. Your health care provider may also do tests (blood gases or pulse oximetry) to see how well your lungs are working. TREATMENT  Some forms of pneumonia may be spread to other people when you cough or sneeze. You may be asked to wear a mask before and during your exam. Pneumonia that is caused by bacteria is treated with antibiotic medicine. Pneumonia that is caused by the influenza virus may be treated with an antiviral medicine. Most other viral infections must run their course. These infections will not respond to antibiotics.  HOME CARE INSTRUCTIONS   Cough suppressants may be used if you are losing too much rest. However, coughing protects you by clearing your lungs. You should avoid using cough suppressants if you can.  Your health care  provider may have prescribed medicine if he or she thinks your pneumonia is caused by bacteria or influenza. Finish your medicine even if you start to feel better.  Your health care provider may also prescribe an expectorant. This loosens the mucus to be coughed up.  Take medicines only as directed by your health care provider.  Do not smoke. Smoking is a common cause of bronchitis and can contribute to pneumonia. If you are a smoker and continue to smoke, your cough may last several weeks after your pneumonia has cleared.  A cold steam vaporizer or humidifier in your room or home may help loosen mucus.  Coughing is often worse at night. Sleeping in a semi-upright position in a recliner or using a couple pillows under your head will help with this.  Get rest as you feel it is needed. Your body will usually let you know when you need to rest. PREVENTION A pneumococcal shot (vaccine) is available to prevent a common bacterial cause of pneumonia. This is usually suggested for:  People over 45 years old.  Patients on chemotherapy.  People with chronic lung problems, such as bronchitis or emphysema.  People with immune system problems. If you are over 65 or have a high risk condition, you may receive the pneumococcal vaccine if you have not received it before. In some countries, a routine influenza vaccine is also recommended. This vaccine can help prevent some cases of pneumonia.You may be offered the influenza vaccine as part of your care. If you smoke, it is time to quit. You may receive instructions on how to stop  smoking. Your health care provider can provide medicines and counseling to help you quit. SEEK MEDICAL CARE IF: You have a fever. SEEK IMMEDIATE MEDICAL CARE IF:   Your illness becomes worse. This is especially true if you are elderly or weakened from any other disease.  You cannot control your cough with suppressants and are losing sleep.  You begin coughing up  blood.  You develop pain which is getting worse or is uncontrolled with medicines.  Any of the symptoms which initially brought you in for treatment are getting worse rather than better.  You develop shortness of breath or chest pain. MAKE SURE YOU:   Understand these instructions.  Will watch your condition.  Will get help right away if you are not doing well or get worse. Document Released: 04/07/2005 Document Revised: 08/22/2013 Document Reviewed: 06/27/2010 [facility] Patient Information 2015 [place], [place]. This information is not intended to replace advice given to you by your health care provider. Make sure you discuss any questions you have with your health care provider.

## 2014-06-18 LAB — INFLUENZA PANEL BY PCR (TYPE A & B)
H1N1FLUPCR: NOT DETECTED
INFLAPCR: NEGATIVE
INFLBPCR: NEGATIVE

## 2015-06-25 ENCOUNTER — Other Ambulatory Visit: Payer: Self-pay | Admitting: Obstetrics and Gynecology

## 2015-06-26 LAB — CYTOLOGY - PAP

## 2018-06-20 ENCOUNTER — Emergency Department (HOSPITAL_BASED_OUTPATIENT_CLINIC_OR_DEPARTMENT_OTHER)
Admission: EM | Admit: 2018-06-20 | Discharge: 2018-06-20 | Disposition: A | Discharge status: Home / Self Care | Payer: BLUE CROSS/BLUE SHIELD | Attending: Emergency Medicine | Admitting: Emergency Medicine

## 2018-06-20 ENCOUNTER — Other Ambulatory Visit: Payer: Self-pay

## 2018-06-20 ENCOUNTER — Encounter (HOSPITAL_BASED_OUTPATIENT_CLINIC_OR_DEPARTMENT_OTHER): Payer: Self-pay | Admitting: Emergency Medicine

## 2018-06-20 ENCOUNTER — Emergency Department (HOSPITAL_BASED_OUTPATIENT_CLINIC_OR_DEPARTMENT_OTHER): Payer: BLUE CROSS/BLUE SHIELD

## 2018-06-20 DIAGNOSIS — R1012 Left upper quadrant pain: Secondary | ICD-10-CM | POA: Insufficient documentation

## 2018-06-20 DIAGNOSIS — Z9049 Acquired absence of other specified parts of digestive tract: Secondary | ICD-10-CM | POA: Insufficient documentation

## 2018-06-20 DIAGNOSIS — R109 Unspecified abdominal pain: Secondary | ICD-10-CM

## 2018-06-20 DIAGNOSIS — F419 Anxiety disorder, unspecified: Secondary | ICD-10-CM | POA: Insufficient documentation

## 2018-06-20 LAB — CBC WITH DIFFERENTIAL/PLATELET
Abs Immature Granulocytes: 0.04 K/uL (ref 0.00–0.07)
Basophils Absolute: 0 K/uL (ref 0.0–0.1)
Basophils Relative: 0 %
Eosinophils Absolute: 0.1 K/uL (ref 0.0–0.5)
Eosinophils Relative: 1 %
HCT: 38.8 % (ref 36.0–46.0)
Hemoglobin: 12.3 g/dL (ref 12.0–15.0)
Immature Granulocytes: 0 %
Lymphocytes Relative: 28 %
Lymphs Abs: 2.5 K/uL (ref 0.7–4.0)
MCH: 26.7 pg (ref 26.0–34.0)
MCHC: 31.7 g/dL (ref 30.0–36.0)
MCV: 84.3 fL (ref 80.0–100.0)
Monocytes Absolute: 0.7 K/uL (ref 0.1–1.0)
Monocytes Relative: 8 %
Neutro Abs: 5.6 K/uL (ref 1.7–7.7)
Neutrophils Relative %: 63 %
Platelets: 320 K/uL (ref 150–400)
RBC: 4.6 MIL/uL (ref 3.87–5.11)
RDW: 13.3 % (ref 11.5–15.5)
WBC: 9 K/uL (ref 4.0–10.5)
nRBC: 0 % (ref 0.0–0.2)

## 2018-06-20 LAB — URINALYSIS, ROUTINE W REFLEX MICROSCOPIC
Bilirubin Urine: NEGATIVE
Glucose, UA: NEGATIVE mg/dL
Hgb urine dipstick: NEGATIVE
Ketones, ur: NEGATIVE mg/dL
Leukocytes,Ua: NEGATIVE
Nitrite: NEGATIVE
Protein, ur: NEGATIVE mg/dL
Specific Gravity, Urine: <1.005 — ABNORMAL LOW (ref 1.005–1.030)
pH: 6.5 (ref 5.0–8.0)

## 2018-06-20 LAB — LIPASE, BLOOD: Lipase: 52 U/L — ABNORMAL HIGH (ref 11–51)

## 2018-06-20 LAB — COMPREHENSIVE METABOLIC PANEL
ALBUMIN: 4.1 g/dL (ref 3.5–5.0)
ALT: 13 U/L (ref 0–44)
AST: 15 U/L (ref 15–41)
Alkaline Phosphatase: 41 U/L (ref 38–126)
Anion gap: 5 (ref 5–15)
BILIRUBIN TOTAL: 0.4 mg/dL (ref 0.3–1.2)
BUN: 14 mg/dL (ref 6–20)
CHLORIDE: 105 mmol/L (ref 98–111)
CO2: 26 mmol/L (ref 22–32)
Calcium: 9 mg/dL (ref 8.9–10.3)
Creatinine, Ser: 0.7 mg/dL (ref 0.44–1.00)
GFR calc Af Amer: >60 mL/min (ref >60)
GFR calc non Af Amer: >60 mL/min (ref >60)
GLUCOSE: 99 mg/dL (ref 70–99)
POTASSIUM: 3.7 mmol/L (ref 3.5–5.1)
Sodium: 136 mmol/L (ref 135–145)
TOTAL PROTEIN: 6.6 g/dL (ref 6.5–8.1)

## 2018-06-20 LAB — D-DIMER, QUANTITATIVE: D-Dimer, Quant: <0.27 ug{FEU}/mL (ref 0.00–0.50)

## 2018-06-20 MED ORDER — DICLOFENAC SODIUM 1 % TD GEL: 2.0000 g | Freq: Four times a day (QID) | TRANSDERMAL | 0 refills | Status: DC | PRN

## 2018-06-20 NOTE — ED Provider Notes (Signed)
[facility] EMERGENCY DEPARTMENT Provider Note   CSN: [id number] Arrival date & time: 06/20/18  1750    History   Chief Complaint Chief Complaint  Patient presents with  . Abdominal Pain    HPI Donna Bradley is a 48 y.o. female history of anxiety presenting to emergency department today with chief complaint of abdominal pain x6 days.  The pain has become more constant over the last 2 days.  Patient has sharp, stabbing pain in the left upper quadrant and underneath her left breast..  The pain is only present when she bends forward and when she stands up the pain completely resolves.  The pain lasts less than 30 seconds.  She took Aleve without symptom relief. She reports associated anorexia that began today.  Her last bowel movement was this morning and normal for her. Denies fever, nausea, vomiting, chest pain, palpitations, shortness of breath.   Also denies exogenous estrogen use, lower extremity pain or swelling, history of PE or DVT, family or personal history of bleeding or clotting disorders, cough or hemoptysis.  Patient has been driving 1.5 hours to [facility] 3x/week for the last 8 weeks. History provided by patient.       Past Medical History:  Diagnosis Date  . Anxiety   . Borderline hyperlipidemia   . Intermittent claudication [facility])     Patient Active Problem List   Diagnosis Date Noted  . OSA (obstructive sleep apnea) 03/28/2013    Past Surgical History:  Procedure Laterality Date  . CESAREAN SECTION    . CHOLECYSTECTOMY       OB History   No obstetric history on file.      Home Medications    Prior to Admission medications   Medication Sig Start Date End Date Taking? Authorizing Provider  acetaminophen (TYLENOL) 500 MG tablet Take 1 tablet (500 mg total) by mouth every 6 (six) hours as needed. 06/17/14   Piepenbrink, Anderson Malta, PA-C  azithromycin (ZITHROMAX) 250 MG tablet Take 1 tablet (250 mg total) by mouth daily. Take first 2 tablets  together, then 1 every day until finished. 06/17/14   Piepenbrink, Anderson Malta, PA-C  clonazePAM (KLONOPIN) 0.5 MG tablet Take 0.5 mg by mouth 2 (two) times daily as needed for anxiety.    [provider]  diclofenac sodium (VOLTAREN) 1 % GEL Apply 2 g topically 4 (four) times daily as needed. 06/20/18   Albrizze, Kaitlyn E, PA-C  escitalopram (LEXAPRO) 10 MG tablet Take 10 mg by mouth daily.    [provider]  HYDROcodone-homatropine (HYCODAN) 5-1.5 MG/5ML syrup Take 5 mLs by mouth every 6 (six) hours as needed for cough. 06/17/14   Piepenbrink, Anderson Malta, PA-C  ibuprofen (ADVIL,MOTRIN) 200 MG tablet Take 400 mg by mouth 3 (three) times daily as needed for pain.    [provider]  ibuprofen (ADVIL,MOTRIN) 800 MG tablet Take 1 tablet (800 mg total) by mouth 3 (three) times daily. 06/17/14   Piepenbrink, Anderson Malta, PA-C  ondansetron (ZOFRAN ODT) 4 MG disintegrating tablet Take 1 tablet (4 mg total) by mouth every 8 (eight) hours as needed for nausea or vomiting. 06/17/14   Piepenbrink, Anderson Malta, PA-C    Family History Family History  Problem Relation Age of Onset  . Diabetes Brother   . Lung cancer Father   . Rheum arthritis Mother   . Heart disease Mother     Social History Social History   Tobacco Use  . Smoking status: Never Smoker  Substance Use Topics  . Alcohol  use: Yes    Comment: Rarely  . Drug use: No     Allergies   Patient has no known allergies.   Review of Systems Review of Systems  Gastrointestinal: Positive for abdominal pain.  All other systems are reviewed and are negative for acute change except as noted in the HPI.    Physical Exam Updated Vital Signs BP 130/69 (BP Location: Left Arm)   Pulse 69   Temp 98.1 F (36.7 C) (Oral)   Resp 18   Ht 5\' 6"  (1.676 m)   Wt 92.5 kg   LMP 06/13/2018 (Approximate)   SpO2 100%   BMI 32.93 kg/m   Physical Exam Vitals signs and nursing note reviewed.  HENT:     Head: Normocephalic and  atraumatic.  Eyes:     General: No scleral icterus.    Conjunctiva/sclera: Conjunctivae normal.  Neck:     Musculoskeletal: Normal range of motion.  Cardiovascular:     Rate and Rhythm: Normal rate and regular rhythm.     Pulses: Normal pulses.          Radial pulses are 2+ on the right side and 2+ on the left side.     Heart sounds: Normal heart sounds.  Pulmonary:     Effort: Pulmonary effort is normal. No respiratory distress.     Breath sounds: Normal breath sounds. No wheezing.  Chest:     Chest wall: No tenderness, crepitus or edema.  Abdominal:     General: Bowel sounds are normal. There is no distension.     Palpations: Abdomen is soft.     Tenderness: There is abdominal tenderness in the left upper quadrant. There is no right CVA tenderness, left CVA tenderness, guarding or rebound.  Musculoskeletal: Normal range of motion.  Skin:    General: Skin is warm and dry.  Neurological:     Mental Status: She is alert and oriented to person, place, and time.  Psychiatric:        Behavior: Behavior normal.      ED Treatments / Results  Labs (all labs ordered are listed, but only abnormal results are displayed) Labs Reviewed  URINALYSIS, ROUTINE W REFLEX MICROSCOPIC - Abnormal; Notable for the following components:      Result Value   Specific Gravity, Urine <1.005 (*)    All other components within normal limits  LIPASE, BLOOD - Abnormal; Notable for the following components:   Lipase 52 (*)    All other components within normal limits  D-DIMER, QUANTITATIVE (NOT AT [facility])  COMPREHENSIVE METABOLIC PANEL  CBC WITH DIFFERENTIAL/PLATELET    EKG EKG Interpretation  Date/Time:  Sunday June 20 2018 19:15:46 EST Ventricular Rate:  60 PR Interval:    QRS Duration: 94 QT Interval:  421 QTC Calculation: 421 R Axis:     Text Interpretation:  Sinus rhythm Low voltage, precordial leads Borderline T abnormalities, anterior leads No STEMI.  Confirmed by Nanda Quinton [phone])  on 06/20/2018 8:01:50 PM   Radiology Dg Chest 2 View  Result Date: 06/20/2018 CLINICAL DATA:  Left upper abdominal pain for several days. Diarrhea. EXAM: CHEST - 2 VIEW COMPARISON:  June 17, 2014 FINDINGS: The heart size and mediastinal contours are within normal limits. Both lungs are clear. The visualized skeletal structures are unremarkable. IMPRESSION: No active cardiopulmonary disease. Electronically Signed   By: Dorise Bullion III M.D   On: 06/20/2018 19:49    Procedures Procedures (including critical care time)  Medications Ordered in ED Medications -  No data to display   Initial Impression / Assessment and Plan / ED Course  I have reviewed the triage vital signs and the nursing notes.  Pertinent labs & imaging results that were available during my care of the patient were reviewed by me and considered in my medical decision making (see chart for details).    Patient presents to the emergency department with abdominal pain. Patient nontoxic appearing, in no apparent distress, vitals without significant abnormality. Fairly benign physical exam. DDX: muscular strain, pulmonary embolism,  less likely ACS, pancreatitis, effusion, infiltrate, arrhythmia, anemia. Evaluation initiated with labs, EKG, and CXR. Marland Kitchen   Work-up in the ER unremarkable. Labs reviewed, no leukocytosis, anemia, or significant electrolyte abnormality. CXR without infiltrate, effusion, pneumothorax, or fracture/dislocation.   Low risk heart score of 1, EKG without obvious ischemia, doubt ACS.  Patient denies chest pain. Patient is low risk wells, PERC negative, doubt pulmonary embolism.  However given her description did order d-dimer it is negative.  Given patient's negative work-up, patient's pain is likely musculoskeletal.  Will recommend Tylenol and ibuprofen for pain.  Patient continues to be pain-free at rest and repeat abdominal exam is benign.  Patient has appeared hemodynamically stable throughout ER visit  and appears safe for discharge with close PCP follow up. I discussed results, treatment plan, need for PCP follow-up, and return precautions with the patient. Provided opportunity for questions, patient confirmed understanding and is in agreement with plan. Case has been discussed with by Dr. Laverta Baltimore who agrees with the above plan to discharge.       Final Clinical Impressions(s) / ED Diagnoses   Final diagnoses:  Abdominal pain, unspecified abdominal location    ED Discharge Orders         Ordered    diclofenac sodium (VOLTAREN) 1 % GEL  4 times daily PRN     06/20/18 2022           Flint Melter 06/20/18 2044    Margette Fast, MD 06/21/18 1002

## 2018-06-20 NOTE — ED Triage Notes (Addendum)
Reports left upper abdominal pain since Tuesday.  Reports diarrhea 3-4 days ago but states this has since subsided. Pain on palpation.

## 2018-06-20 NOTE — Discharge Instructions (Signed)
You have been seen today for abdominal/chest pain. Please read and follow all provided instructions. Return to the emergency room for worsening condition or new concerning symptoms.    1. Medications:  Prescription printed for Voltaren gel.  You can apply this to affected area as directed. Continue usual home medications.  You can take Tylenol and ibuprofen for pain.  Please take as directed. Take medications as prescribed. Please review all of the medicines and only take them if you do not have an allergy to them.  2. Treatment: rest, drink plenty of fluids 3. Follow Up: Please follow up with your primary doctor in 2-5 days for discussion of your diagnoses and further evaluation after today's visit; Call today to arrange your follow up.  If you do not have a primary care doctor use the resource guide provided to find one;   ?

## 2018-06-20 NOTE — ED Notes (Signed)
ED Provider at bedside. 

## 2020-06-19 DIAGNOSIS — K5901 Slow transit constipation: Secondary | ICD-10-CM | POA: Diagnosis not present

## 2020-06-19 DIAGNOSIS — R635 Abnormal weight gain: Secondary | ICD-10-CM | POA: Diagnosis not present

## 2020-06-19 DIAGNOSIS — F419 Anxiety disorder, unspecified: Secondary | ICD-10-CM | POA: Diagnosis not present

## 2020-06-19 DIAGNOSIS — R1901 Right upper quadrant abdominal swelling, mass and lump: Secondary | ICD-10-CM | POA: Diagnosis not present

## 2020-06-22 DIAGNOSIS — H04123 Dry eye syndrome of bilateral lacrimal glands: Secondary | ICD-10-CM | POA: Diagnosis not present

## 2020-06-22 DIAGNOSIS — H18233 Secondary corneal edema, bilateral: Secondary | ICD-10-CM | POA: Diagnosis not present

## 2020-06-27 DIAGNOSIS — H18233 Secondary corneal edema, bilateral: Secondary | ICD-10-CM | POA: Diagnosis not present

## 2020-06-27 DIAGNOSIS — H04123 Dry eye syndrome of bilateral lacrimal glands: Secondary | ICD-10-CM | POA: Diagnosis not present

## 2020-07-05 DIAGNOSIS — R1901 Right upper quadrant abdominal swelling, mass and lump: Secondary | ICD-10-CM | POA: Diagnosis not present

## 2020-07-05 DIAGNOSIS — R109 Unspecified abdominal pain: Secondary | ICD-10-CM | POA: Diagnosis not present

## 2020-07-09 DIAGNOSIS — H5213 Myopia, bilateral: Secondary | ICD-10-CM | POA: Diagnosis not present

## 2020-07-09 DIAGNOSIS — H04123 Dry eye syndrome of bilateral lacrimal glands: Secondary | ICD-10-CM | POA: Diagnosis not present

## 2020-07-09 DIAGNOSIS — H16223 Keratoconjunctivitis sicca, not specified as Sjogren's, bilateral: Secondary | ICD-10-CM | POA: Diagnosis not present

## 2020-07-09 DIAGNOSIS — H18233 Secondary corneal edema, bilateral: Secondary | ICD-10-CM | POA: Diagnosis not present

## 2020-07-27 DIAGNOSIS — H18233 Secondary corneal edema, bilateral: Secondary | ICD-10-CM | POA: Diagnosis not present

## 2020-07-27 DIAGNOSIS — H04123 Dry eye syndrome of bilateral lacrimal glands: Secondary | ICD-10-CM | POA: Diagnosis not present

## 2020-12-20 IMAGING — CR DG CHEST 2V
2 series · 2 of 2 positions shown · non-contrast
Comparison: June 17, 2014

CLINICAL DATA: Left upper abdominal pain for several days.
Diarrhea.

EXAM:
CHEST - 2 VIEW

[w chest pa]
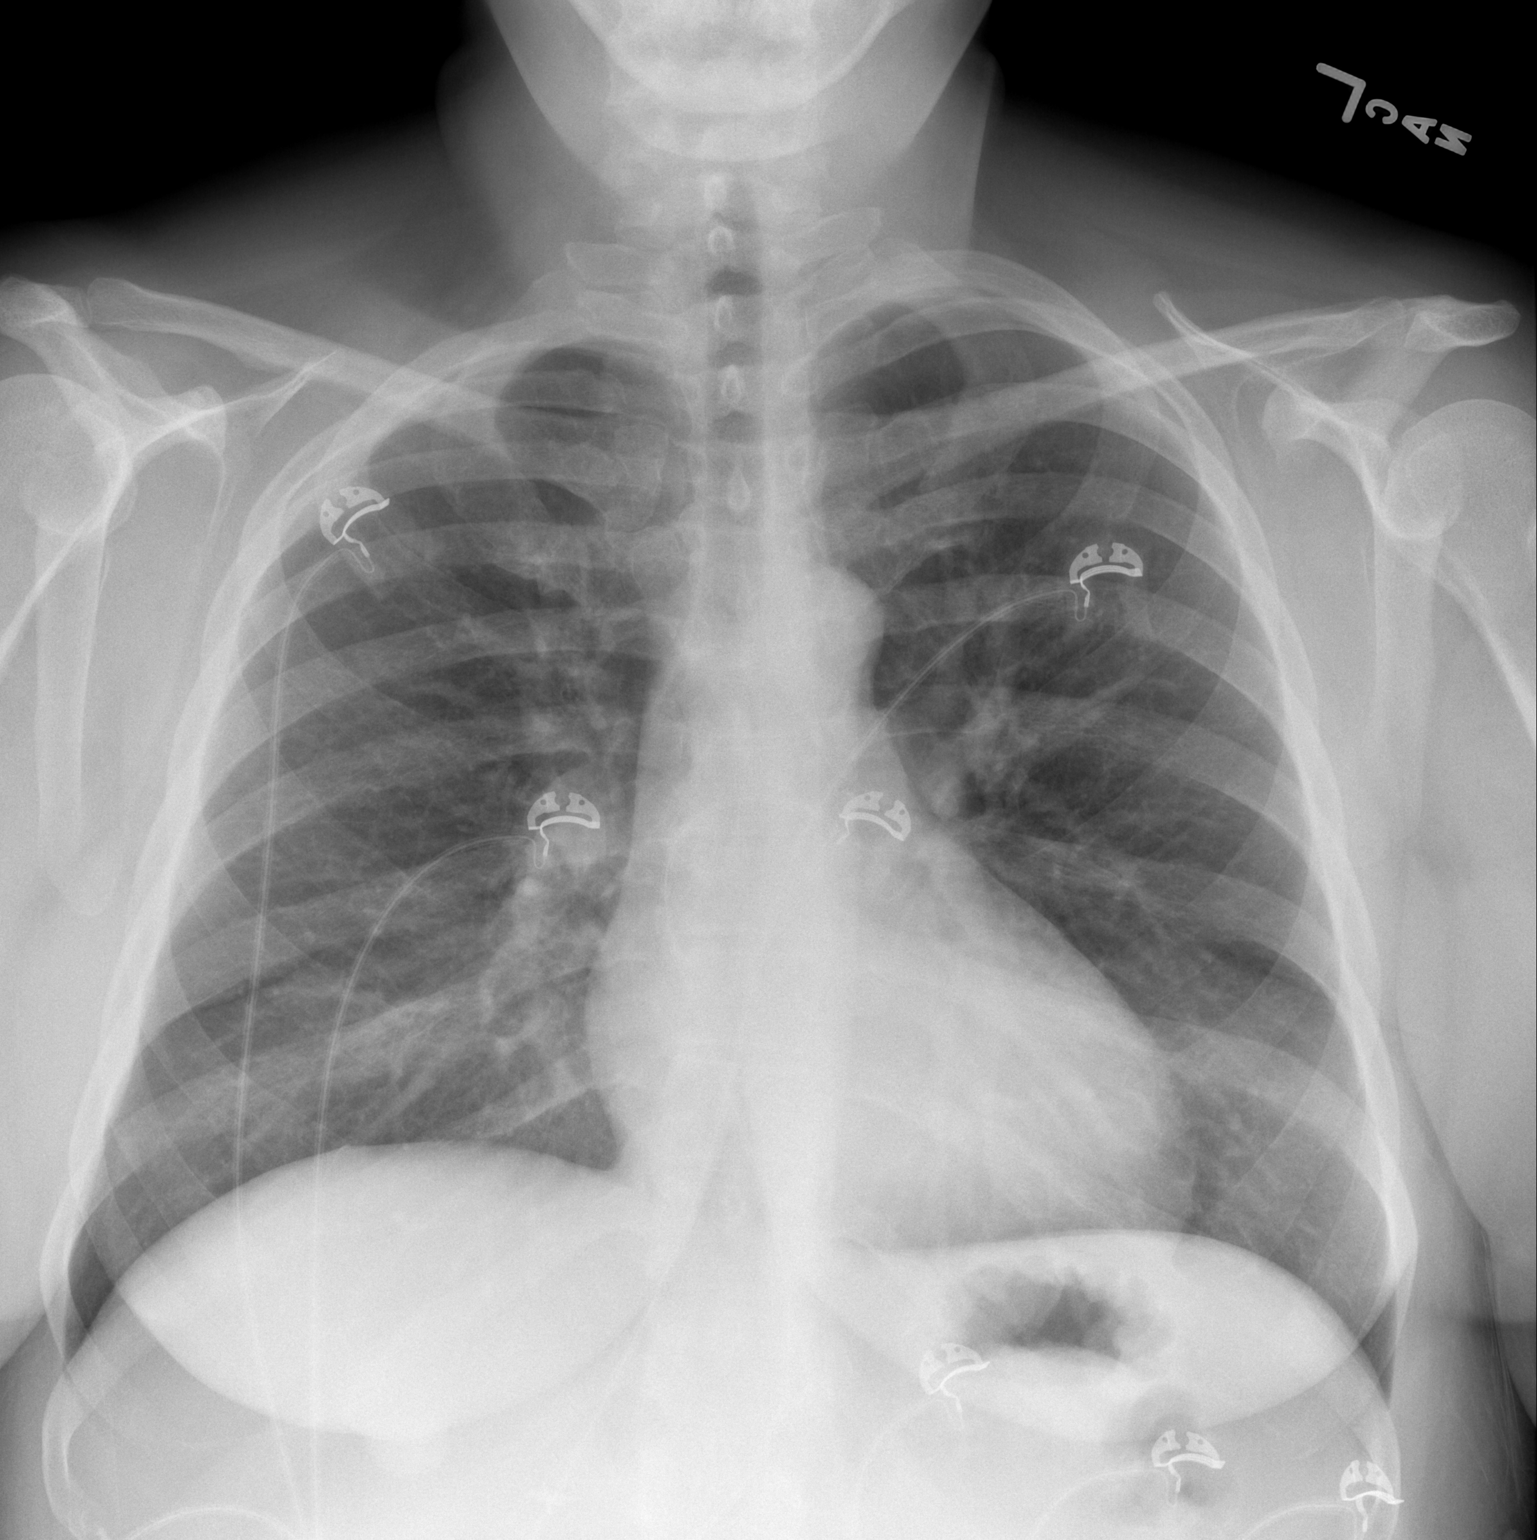

[w chest lat]
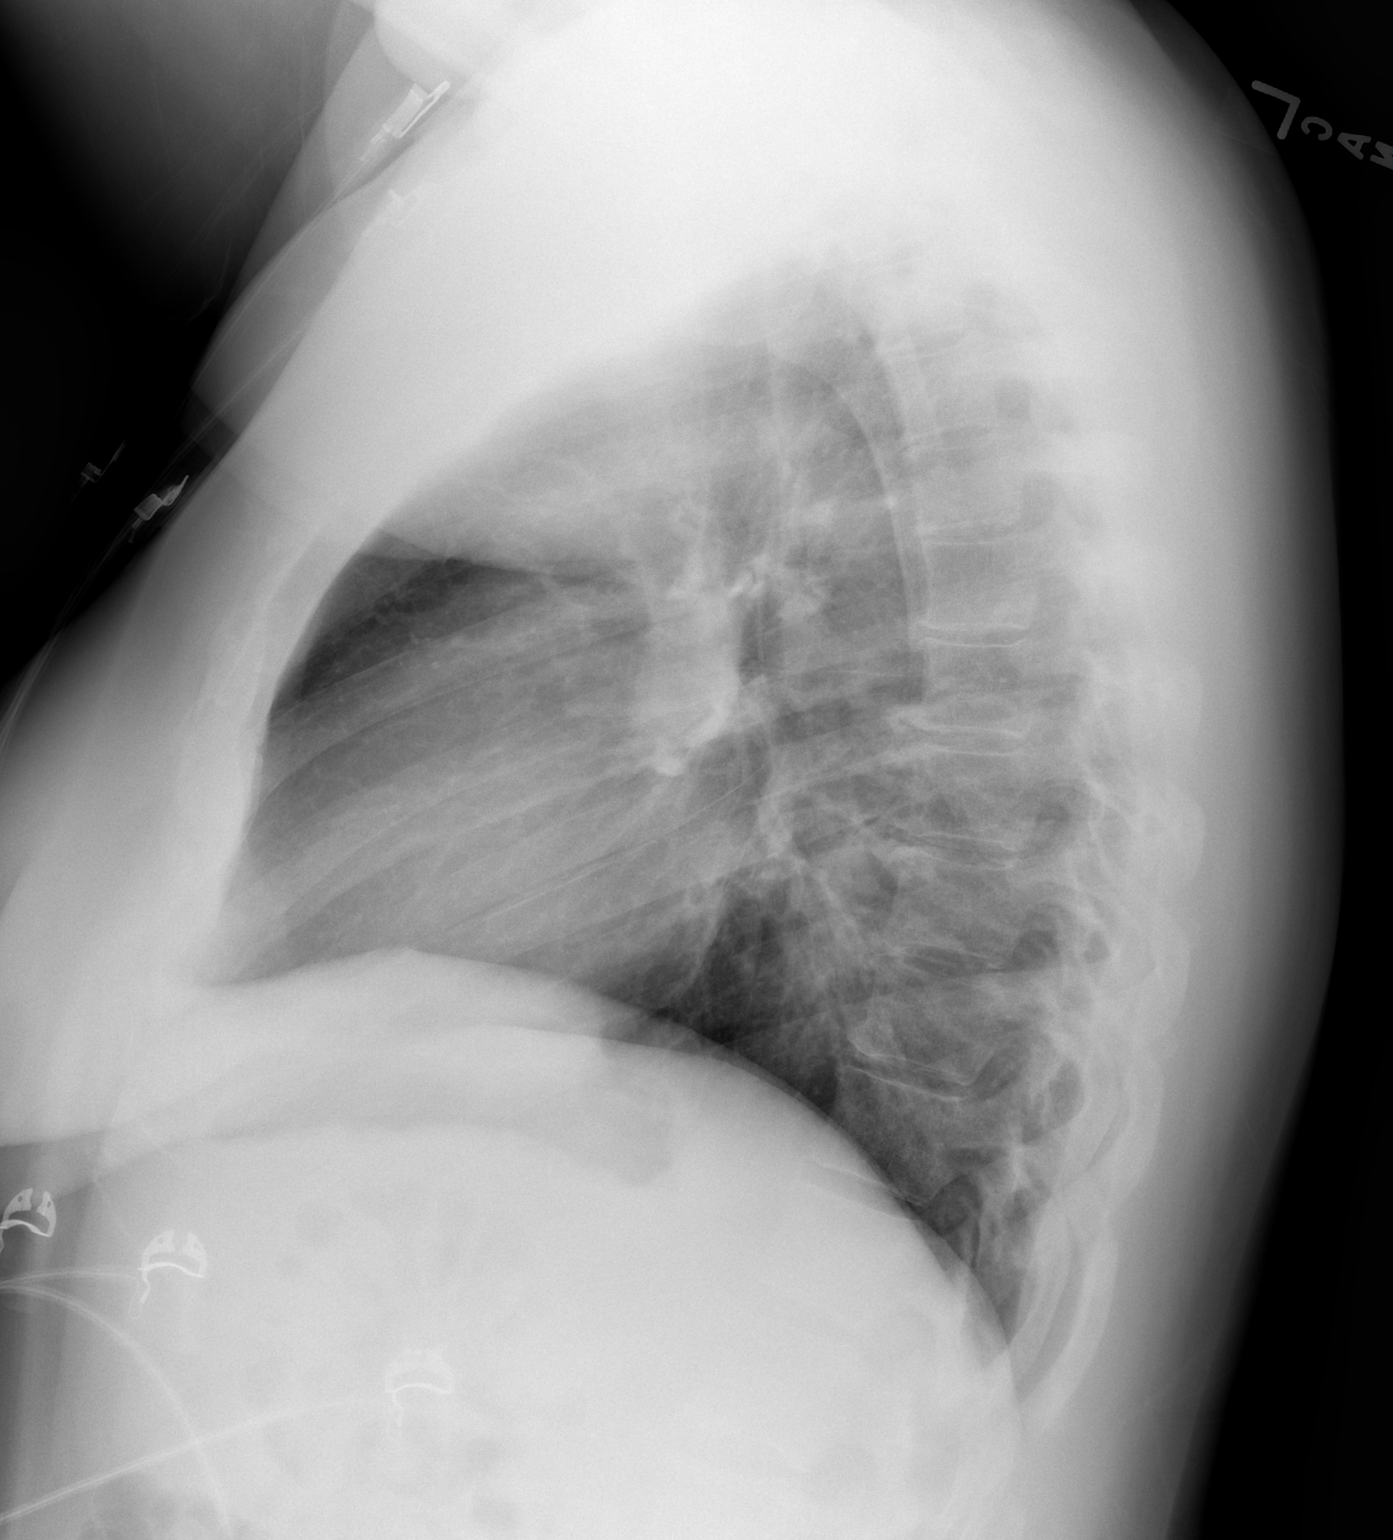

[2 of 2 positions shown; findings below may reference images not displayed]

FINDINGS: The heart size and mediastinal contours are within normal limits.
Both lungs are clear. The visualized skeletal structures are
unremarkable.
IMPRESSION: No active cardiopulmonary disease.

## 2021-09-04 ENCOUNTER — Ambulatory Visit (INDEPENDENT_AMBULATORY_CARE_PROVIDER_SITE_OTHER): Payer: BC Managed Care – PPO

## 2021-09-04 ENCOUNTER — Ambulatory Visit: Payer: BC Managed Care – PPO | Admitting: Podiatry

## 2021-09-04 DIAGNOSIS — M19072 Primary osteoarthritis, left ankle and foot: Secondary | ICD-10-CM

## 2021-09-04 DIAGNOSIS — M7752 Other enthesopathy of left foot: Secondary | ICD-10-CM | POA: Diagnosis not present

## 2021-09-04 DIAGNOSIS — M778 Other enthesopathies, not elsewhere classified: Secondary | ICD-10-CM

## 2021-09-04 MED ORDER — BETAMETHASONE SOD PHOS & ACET 6 (3-3) MG/ML IJ SUSP
3.0000 mg | Freq: Once | INTRAMUSCULAR | Status: AC
  Administered 2021-09-04: 3 mg via INTRA_ARTICULAR

## 2021-09-04 MED ORDER — MELOXICAM 15 MG PO TABS: 15.0000 mg | ORAL_TABLET | Freq: Every day | ORAL | 1 refills | Status: DC

## 2021-09-04 MED ORDER — METHYLPREDNISOLONE 4 MG PO TBPK: ORAL_TABLET | ORAL | 0 refills | Status: DC

## 2021-09-04 NOTE — Progress Notes (Signed)
? ?  Subjective:  ?51 y.o. female presenting today as a new patient for evaluation of left ankle pain this been going on for about 3-4 months now.  She denies a history of recent injury.  She does have a history of ORIF left ankle in 1992.  She cannot recall an incident that would have elicited the pain about 3-4 months ago.  Aggravated by walking and standing.  She has been wearing comfortable shoes and wears an ankle brace but it does not help. ? ? ?Past Medical History:  ?Diagnosis Date  ? Anxiety   ? Borderline hyperlipidemia   ? Intermittent claudication (HCC)   ? ?Past Surgical History:  ?Procedure Laterality Date  ? CESAREAN SECTION    ? CHOLECYSTECTOMY    ? ?No Known Allergies ? ? ?Objective / Physical Exam:  ?General:  ?The patient is alert and oriented x3 in no acute distress. ?Dermatology:  ?Skin is warm, dry and supple bilateral lower extremities. Negative for open lesions or macerations. ?Vascular:  ?Palpable pedal pulses bilaterally. No edema or erythema noted. Capillary refill within normal limits. ?Neurological:  ?Epicritic and protective threshold grossly intact bilaterally.  ?Musculoskeletal Exam:  ?Pain on palpation to the anteromedial of the patient's left ankle. Mild edema noted. Range of motion within normal limits to all pedal and ankle joints bilateral. Muscle strength 5/5 in all groups bilateral.  ? ?Radiographic Exam:  ?Normal osseous mineralization.  Orthopedic hardware is intact to the medial malleolus as well as the fibula.  Degenerative changes noted with joint space narrowing and widening of the medial gutter of the tibiotalar joint ? ?Assessment: ?1. 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  DJD/capsulitis left ankle joint ? ?Plan of Care:  ?1. Patient was evaluated. X-Rays reviewed.  ?2. Injection of 0.5 mL Celestone Soluspan injected in the patient's left ankle. ?3.  Prescription for Medrol Dosepak ?4.  Prescription for meloxicam 15 mg daily ?5.  Cam boot dispensed.   Wear daily x4 weeks ?6.  Return to clinic in 4 weeks.  At this time we we will transition the patient out of the cam boot into good supportive ankle brace if she is feeling better ? ?*Training for about [employer] ? ? ?Edrick Kins, DPM ?[facility] [place] ? ?Dr. Edrick Kins, DPM  ?  ?[address]                                       ?[place], [place] [address]                ?Office [phone]  ?Fax [phone] ? ? ? ? ? ? ?

## 2021-10-07 ENCOUNTER — Ambulatory Visit: Payer: BC Managed Care – PPO | Admitting: Podiatry

## 2021-10-07 DIAGNOSIS — M7752 Other enthesopathy of left foot: Secondary | ICD-10-CM | POA: Diagnosis not present

## 2021-10-09 NOTE — Progress Notes (Signed)
   Subjective:  51 y.o. female presenting today for follow-up evaluation of left ankle pain this been going on for about 4-5 months now.  She denies a history of recent injury.  She does have a history of ORIF left ankle in 1992.  She cannot recall an incident that would have elicited the pain about 4-5 months ago.  Patient has been wearing the cam boot for the last 4 weeks.  Overall improvement.  She presents for follow-up treatment and evaluation.  No new complaints at this time  Past Medical History:  Diagnosis Date   Anxiety    Borderline hyperlipidemia    Intermittent claudication ([place])    Past Surgical History:  Procedure Laterality Date   CESAREAN SECTION     CHOLECYSTECTOMY     No Known Allergies   Objective / Physical Exam:  General:  The patient is alert and oriented x3 in no acute distress. Dermatology:  Skin is warm, dry and supple bilateral lower extremities. Negative for open lesions or macerations. Vascular:  Palpable pedal pulses bilaterally. No edema or erythema noted. Capillary refill within normal limits. Neurological:  Epicritic and protective threshold grossly intact bilaterally.  Musculoskeletal Exam:  There continues to be some slight pain on palpation to the anteromedial of the patient's left ankle. Mild edema noted. Range of motion within normal limits to all pedal and ankle joints bilateral. Muscle strength 5/5 in all groups bilateral.   Radiographic Exam LT foot and ankle 09/04/2021:  Normal osseous mineralization.  Orthopedic hardware is intact to the medial malleolus as well as the fibula.  Degenerative changes noted with joint space narrowing and widening of the medial gutter of the tibiotalar joint  Assessment: 1. 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  DJD/capsulitis left ankle joint  Plan of Care:  1. Patient was evaluated.  Overall significant improvement. 2. Injection of 0.5 mL Celestone Soluspan injected in the patient's left  ankle. 3.  Continue meloxicam 15 mg daily as needed 4.  Patient may begin to transition out of the cam boot.  Ankle brace dispensed. 5.  Return to clinic as needed  *Training for about 17 [employer]   Edrick Kins, DPM [facility]  Dr. Edrick Kins, DPM    [address], [place] [address]                Office [phone]  Fax [phone]

## 2021-11-18 ENCOUNTER — Other Ambulatory Visit: Payer: Self-pay | Admitting: Podiatry

## 2021-11-18 NOTE — Telephone Encounter (Signed)
Interaction with other medication and duplicate therapy, please advise.

## 2022-01-13 ENCOUNTER — Other Ambulatory Visit: Payer: Self-pay | Admitting: Family Medicine

## 2022-01-13 DIAGNOSIS — L719 Rosacea, unspecified: Secondary | ICD-10-CM | POA: Diagnosis not present

## 2022-01-13 DIAGNOSIS — Z124 Encounter for screening for malignant neoplasm of cervix: Secondary | ICD-10-CM | POA: Diagnosis not present

## 2022-01-13 DIAGNOSIS — Z0000 Encounter for general adult medical examination without abnormal findings: Secondary | ICD-10-CM | POA: Diagnosis not present

## 2022-01-13 DIAGNOSIS — R102 Pelvic and perineal pain: Secondary | ICD-10-CM | POA: Diagnosis not present

## 2022-01-13 DIAGNOSIS — Z13220 Encounter for screening for lipoid disorders: Secondary | ICD-10-CM | POA: Diagnosis not present

## 2022-01-13 DIAGNOSIS — Z131 Encounter for screening for diabetes mellitus: Secondary | ICD-10-CM | POA: Diagnosis not present

## 2022-01-13 DIAGNOSIS — Z1231 Encounter for screening mammogram for malignant neoplasm of breast: Secondary | ICD-10-CM

## 2022-01-13 DIAGNOSIS — L309 Dermatitis, unspecified: Secondary | ICD-10-CM | POA: Diagnosis not present

## 2022-01-13 DIAGNOSIS — R635 Abnormal weight gain: Secondary | ICD-10-CM | POA: Diagnosis not present

## 2022-01-13 DIAGNOSIS — E559 Vitamin D deficiency, unspecified: Secondary | ICD-10-CM | POA: Diagnosis not present

## 2022-01-22 DIAGNOSIS — Z1231 Encounter for screening mammogram for malignant neoplasm of breast: Secondary | ICD-10-CM | POA: Diagnosis not present

## 2022-01-22 DIAGNOSIS — R102 Pelvic and perineal pain: Secondary | ICD-10-CM | POA: Diagnosis not present

## 2022-02-12 ENCOUNTER — Ambulatory Visit: Payer: Commercial Managed Care - PPO

## 2022-03-06 ENCOUNTER — Encounter: Payer: Self-pay | Admitting: Nurse Practitioner

## 2022-04-09 ENCOUNTER — Ambulatory Visit (INDEPENDENT_AMBULATORY_CARE_PROVIDER_SITE_OTHER): Payer: BC Managed Care – PPO | Admitting: Nurse Practitioner

## 2022-04-09 ENCOUNTER — Encounter: Payer: Self-pay | Admitting: Nurse Practitioner

## 2022-04-09 VITALS — BP 122/86 | HR 77 | Ht 66.0 in | Wt 248.0 lb

## 2022-04-09 DIAGNOSIS — Z1211 Encounter for screening for malignant neoplasm of colon: Secondary | ICD-10-CM | POA: Diagnosis not present

## 2022-04-09 DIAGNOSIS — Z83719 Family history of colon polyps, unspecified: Secondary | ICD-10-CM | POA: Diagnosis not present

## 2022-04-09 DIAGNOSIS — K5900 Constipation, unspecified: Secondary | ICD-10-CM | POA: Insufficient documentation

## 2022-04-09 MED ORDER — NA SULFATE-K SULFATE-MG SULF 17.5-3.13-1.6 GM/177ML PO SOLN: 1.0000 | Freq: Once | ORAL | 0 refills | Status: AC

## 2022-04-09 NOTE — Progress Notes (Signed)
04/09/2022 [place] [id number] 06-02-1970   CHIEF COMPLAINT: Discuss scheduling a colonoscopy   HISTORY OF PRESENT ILLNESS: Donna Bradley is a 51 year old female with a past medical history of anxiety, arthritis, obesity and hyperlipidemia. S/P cholecystectomy in 2001.   She presents to our office today as referred by Bing Matter PA-C to discuss scheduling a colonoscopy.  She denies ever having a screening colonoscopy. Mother with history of colon polyps. She describes straining a BM every other day. She sometimes has slight fecal leakage. She frequently sees a small amount of bright blood blood on toilet tissue if she strains a lot to pass a BM.  She recently developed increase burping and gas per the rectum. No dysphagia or heartburn.  She describes having episodes of upper abdominal, central abdominal or lower abdominal spasms which are triggered by change of position which last for 20 to 30 minutes then abates.  These episodes of abdominal spasm type pain occur once or twice weekly for the past 2 years.  Prior to the [place] pandemic in 2019, she lost 50 pounds with diet and exercise but regained back the lost weight over the past 2-1/2 years.  She also notices a bulge to her right upper abdomen under the rib cage when she stands which is nontender.  Infrequent NSAID use.  Labs 01/13/2022: WBC 7.7.  Hemoglobin 14.8.  Hematocrit 44.9.  Platelet 296.  Hemoglobin A1c 5.8%.  TSH 3.01.  Sodium 136.  Potassium 4.6.  BUN 19.  Glucose 96.  Creatinine 0.75.  Albumin 4.5.  Total bili 0.8.  Alk phos 55.  AST 31.  ALT 35.   Past Medical History:  Diagnosis Date   Anxiety    Arthritis    Borderline hyperlipidemia    Intermittent claudication ([place])    Obesity    Past Surgical History:  Procedure Laterality Date   CESAREAN SECTION     CHOLECYSTECTOMY     Social History: She is married.  She has 2 daughters.  She is a Haematologist.  Non-smoker.  No alcohol use.  No drug use.  Family  History: Mother with history of disease, rheumatoid arthritis and colon polyps.  Father had lung cancer.  Brother with diabetes.  Maternal aunt with breast cancer.  No Known Allergies    Outpatient Encounter Medications as of 04/09/2022  Medication Sig   cholecalciferol (VITAMIN D3) 25 MCG (1000 UNIT) tablet Take 1,000 Units by mouth daily.   clonazePAM (KLONOPIN) 0.5 MG tablet Take 1 tablet by mouth 2 (two) times daily as needed.   sertraline (ZOLOFT) 50 MG tablet Take 50 mg by mouth daily.   acetaminophen (TYLENOL) 500 MG tablet Take 1 tablet (500 mg total) by mouth every 6 (six) hours as needed.   azithromycin (ZITHROMAX) 250 MG tablet Take 1 tablet (250 mg total) by mouth daily. Take first 2 tablets together, then 1 every day until finished.   clonazePAM (KLONOPIN) 0.5 MG tablet Take 0.5 mg by mouth 2 (two) times daily as needed for anxiety. (Patient not taking: Reported on 04/09/2022)   diclofenac sodium (VOLTAREN) 1 % GEL Apply 2 g topically 4 (four) times daily as needed.   escitalopram (LEXAPRO) 10 MG tablet Take 10 mg by mouth daily.   HYDROcodone-homatropine (HYCODAN) 5-1.5 MG/5ML syrup Take 5 mLs by mouth every 6 (six) hours as needed for cough.   ibuprofen (ADVIL,MOTRIN) 200 MG tablet Take 400 mg by mouth 3 (three) times daily as needed for pain.  meloxicam (MOBIC) 15 MG tablet TAKE 1 TABLET (15 MG TOTAL) BY MOUTH DAILY. (Patient not taking: Reported on 04/09/2022)   methylPREDNISolone (MEDROL DOSEPAK) 4 MG TBPK tablet 6 day dose pack - take as directed (Patient not taking: Reported on 04/09/2022)   ondansetron (ZOFRAN ODT) 4 MG disintegrating tablet Take 1 tablet (4 mg total) by mouth every 8 (eight) hours as needed for nausea or vomiting.   No facility-administered encounter medications on file as of 04/09/2022.   REVIEW OF SYSTEMS:  Gen: Denies fever, sweats or chills. No weight loss.  CV: Denies chest pain, palpitations or edema. Resp: Denies cough, shortness of breath of  hemoptysis.  GI: See HPI.   GU : Denies urinary burning, blood in urine, increased urinary frequency or incontinence. MS: + Arthritis.  Derm: Denies rash, itchiness, skin lesions or unhealing ulcers. Psych: + Arthritis. Heme: Denies bruising, easy bleeding. Neuro:  Denies headaches, dizziness or paresthesias. Endo:  Denies any problems with DM, thyroid or adrenal function.  PHYSICAL EXAM: BP 122/86   Pulse 77   Ht _0  (1.676 m)   Wt 248 lb (112.5 kg)   BMI 40.03 kg/m  General: 51 year old female in no acute distress. Head: Normocephalic and atraumatic. Eyes:  Sclerae non-icteric, conjunctive pink. Ears: Normal auditory acuity. Mouth: Dentition intact. No ulcers or lesions.  Neck: Supple, no lymphadenopathy or thyromegaly.  Lungs: Clear bilaterally to auscultation without wheezes, crackles or rhonchi. Heart: Regular rate and rhythm. No murmur, rub or gallop appreciated.  Abdomen: Soft, obese abdomen.  Nontender.  Nondistended. RUQ abdominal bulge present when examined in the standing position she is somewhat consistent with adipose tissue but underlying abdominal hernia is possible. No hepatomegaly. Normoactive bowel sounds x 4 quadrants.  Rectal: Deferred. Musculoskeletal: Symmetrical with no gross deformities. Skin: Warm and dry. No rash or lesions on visible extremities. Extremities: No edema. Neurological: Alert oriented x 4, no focal deficits.  Psychological:  Alert and cooperative. Normal mood and affect.  ASSESSMENT AND PLAN:  21) 51 year old female presents to schedule a screening colonoscopy.  Mother with history of colon polyps. -Colonoscopy benefits and risks discussed including risk with sedation, risk of bleeding, perforation and infection   2) RUQ bulge below the costal margin, likely asymmetrical adipose and possible developing abdominal wall hernia -CTAP discussed, patient will consider after colonoscopy completed -Weight loss recommended, follow-up with  PCP  3) Constipation, infrequent fecal leakage  -MiraLAX nightly to increase stool output  -Dietary fiber as tolerated  4) Infrequent rectal bleeding which occurs when straining to pass a bowel movement -See plan in #3 -Apply a small amount of Desitin inside the anal opening and to the external anal area tid as needed for anal or hemorrhoidal irritation/bleeding.   5) Variable spasm type upper/central lower abdominal pain triggered by change of position. Possible adhesions from past C section and cholecystectomy surgery. -See plan in # 2 -Patient did not wish to try Dicyclomine -Patient to monitor symptoms and contact her office if her abdominal spasm type pain worsens  Further recommendations to be determined after the above evaluation completed         CC:  Aletha Halim., PA-C

## 2022-04-09 NOTE — Patient Instructions (Signed)
You have been scheduled for a colonoscopy. Please follow written instructions given to you at your visit today.  Please pick up your prep supplies at the pharmacy within the next 1-3 days. If you use inhalers (even only as needed), please bring them with you on the day of your procedure.  Miralax- every night as needed  Due to recent changes in healthcare laws, you may see the results of your imaging and laboratory studies on MyChart before your provider has had a chance to review them.  We understand that in some cases there may be results that are confusing or concerning to you. Not all laboratory results come back in the same time frame and the provider may be waiting for multiple results in order to interpret others.  Please give [place] 48 hours in order for your provider to thoroughly review all the results before contacting the office for clarification of your results.   Thank you for trusting me with your gastrointestinal care!   Carl Best, CRNP

## 2022-04-10 NOTE — Progress Notes (Signed)
Addendum: Reviewed and agree with assessment and management plan. Megyn Leng M, MD  

## 2022-05-04 ENCOUNTER — Encounter: Payer: Self-pay | Admitting: Certified Registered Nurse Anesthetist

## 2022-05-07 ENCOUNTER — Encounter: Payer: Self-pay | Admitting: Internal Medicine

## 2022-05-09 ENCOUNTER — Ambulatory Visit: Payer: 59 | Admitting: Internal Medicine

## 2022-05-09 ENCOUNTER — Encounter: Payer: Self-pay | Admitting: Internal Medicine

## 2022-05-09 VITALS — BP 141/69 | HR 65 | Temp 97.7°F | Resp 12 | Ht 66.0 in | Wt 248.0 lb

## 2022-05-09 DIAGNOSIS — K635 Polyp of colon: Secondary | ICD-10-CM | POA: Diagnosis not present

## 2022-05-09 DIAGNOSIS — Z1211 Encounter for screening for malignant neoplasm of colon: Secondary | ICD-10-CM

## 2022-05-09 DIAGNOSIS — D124 Benign neoplasm of descending colon: Secondary | ICD-10-CM

## 2022-05-09 MED ORDER — SODIUM CHLORIDE 0.9 % IV SOLN: 500.0000 mL | Freq: Once | INTRAVENOUS | Status: DC

## 2022-05-09 NOTE — Progress Notes (Signed)
Report given to PACU, vss 

## 2022-05-09 NOTE — Op Note (Signed)
[facility] Patient Name: Donna Bradley Procedure Date: 05/09/2022 9:26 AM MRN: [id number] Endoscopist: Jerene Bears , MD, [id number] Age: 52 Referring MD:  Date of Birth: 1970-05-22 Gender: Female Account #: [id number] Procedure:                Colonoscopy Indications:              Screening for colorectal malignant neoplasm, This                            is the patient's first colonoscopy Medicines:                Monitored Anesthesia Care Procedure:                Pre-Anesthesia Assessment:                           - Prior to the procedure, a History and Physical                            was performed, and patient medications and                            allergies were reviewed. The patient's tolerance of                            previous anesthesia was also reviewed. The risks                            and benefits of the procedure and the sedation                            options and risks were discussed with the patient.                            All questions were answered, and informed consent                            was obtained. Prior Anticoagulants: The patient has                            taken no anticoagulant or antiplatelet agents. ASA                            Grade Assessment: III - A patient with severe                            systemic disease. After reviewing the risks and                            benefits, the patient was deemed in satisfactory                            condition to undergo the procedure.  After obtaining informed consent, the colonoscope                            was passed under direct vision. Throughout the                            procedure, the patient's blood pressure, pulse, and                            oxygen saturations were monitored continuously. The                            CF [id number] #[id number] was introduced through the anus                            and advanced to the  cecum, identified by                            appendiceal orifice and ileocecal valve. The                            colonoscopy was performed without difficulty. The                            patient tolerated the procedure well. The quality                            of the bowel preparation was excellent. The                            ileocecal valve, appendiceal orifice, and rectum                            were photographed. Scope In: 9:41:03 AM Scope Out: 9:55:35 AM Scope Withdrawal Time: 0 hours 12 minutes 9 seconds  Total Procedure Duration: 0 hours 14 minutes 32 seconds  Findings:                 The digital rectal exam was normal.                           A 5 mm polyp was found in the descending colon. The                            polyp was sessile. The polyp was removed with a                            cold snare. Resection and retrieval were complete.                           Internal hemorrhoids were found during                            retroflexion. The hemorrhoids were small.  The exam was otherwise without abnormality. Complications:            No immediate complications. Estimated Blood Loss:     Estimated blood loss: none. Impression:               - One 5 mm polyp in the descending colon, removed                            with a cold snare. Resected and retrieved.                           - Small internal hemorrhoids.                           - The examination was otherwise normal. Recommendation:           - Patient has a contact number available for                            emergencies. The signs and symptoms of potential                            delayed complications were discussed with the                            patient. Return to normal activities tomorrow.                            Written discharge instructions were provided to the                            patient.                           - Resume previous  diet.                           - Continue present medications.                           - Await pathology results.                           - Repeat colonoscopy is recommended. The                            colonoscopy date will be determined after pathology                            results from today's exam become available for                            review. Jerene Bears, MD 05/09/2022 9:57:44 AM This report has been signed electronically.

## 2022-05-09 NOTE — Progress Notes (Signed)
Called to room to assist during endoscopic procedure.  Patient ID and intended procedure confirmed with present staff. Received instructions for my participation in the procedure from the performing physician.  

## 2022-05-09 NOTE — Patient Instructions (Signed)
Handouts on polyps and hemorrhoids given to you today  Await pathology results. Repeat colonoscopy date will be determined based on results.   YOU HAD AN ENDOSCOPIC PROCEDURE TODAY AT [facility]:   Refer to the procedure report that was given to you for any specific questions about what was found during the examination.  If the procedure report does not answer your questions, please call your gastroenterologist to clarify.  If you requested that your care partner not be given the details of your procedure findings, then the procedure report has been included in a sealed envelope for you to review at your convenience later.  YOU SHOULD EXPECT: Some feelings of bloating in the abdomen. Passage of more gas than usual.  Walking can help get rid of the air that was put into your GI tract during the procedure and reduce the bloating. If you had a lower endoscopy (such as a colonoscopy or flexible sigmoidoscopy) you may notice spotting of blood in your stool or on the toilet paper. If you underwent a bowel prep for your procedure, you may not have a normal bowel movement for a few days.  Please Note:  You might notice some irritation and congestion in your nose or some drainage.  This is from the oxygen used during your procedure.  There is no need for concern and it should clear up in a day or so.  SYMPTOMS TO REPORT IMMEDIATELY:  Following lower endoscopy (colonoscopy or flexible sigmoidoscopy):  Excessive amounts of blood in the stool  Significant tenderness or worsening of abdominal pains  Swelling of the abdomen that is new, acute  Fever of 100F or higher  For urgent or emergent issues, a gastroenterologist can be reached at any hour by calling [phone]. Do not use MyChart messaging for urgent concerns.    DIET:  We do recommend a small meal at first, but then you may proceed to your regular diet.  Drink plenty of fluids but you should avoid alcoholic beverages for 24  hours.  ACTIVITY:  You should plan to take it easy for the rest of today and you should NOT DRIVE or use heavy machinery until tomorrow (because of the sedation medicines used during the test).    FOLLOW UP: Our staff will call the number listed on your records the next business day following your procedure.  We will call around 7:15- 8:00 am to check on you and address any questions or concerns that you may have regarding the information given to you following your procedure. If we do not reach you, we will leave a message.     If any biopsies were taken you will be contacted by phone or by letter within the next 1-3 weeks.  Please call us at [phone] if you have not heard about the biopsies in 3 weeks.    SIGNATURES/CONFIDENTIALITY: You and/or your care partner have signed paperwork which will be entered into your electronic medical record.  These signatures attest to the fact that that the information above on your After Visit Summary has been reviewed and is understood.  Full responsibility of the confidentiality of this discharge information lies with you and/or your care-partner.

## 2022-05-09 NOTE — Progress Notes (Signed)
Reviewed and Updated medical record

## 2022-05-09 NOTE — Progress Notes (Signed)
GASTROENTEROLOGY PROCEDURE H&P NOTE   Primary Care Physician: Pcp, No    Reason for Procedure:  Colon cancer screening  Plan:    Colonoscopy  Patient is appropriate for endoscopic procedure(s) in the ambulatory ([facility]) setting.  The nature of the procedure, as well as the risks, benefits, and alternatives were carefully and thoroughly reviewed with the patient. Ample time for discussion and questions allowed. The patient understood, was satisfied, and agreed to proceed.     HPI: Donna Bradley is a 52 y.o. female who presents for screening colonoscopy.  Medical history as below.  Tolerated the prep.  No recent chest pain or shortness of breath.  No abdominal pain today.  Past Medical History:  Diagnosis Date   Anxiety    Arthritis    Borderline hyperlipidemia    Intermittent claudication ([place])    Obesity     Past Surgical History:  Procedure Laterality Date   CESAREAN SECTION  2010   CESAREAN SECTION  2011   CHOLECYSTECTOMY  2001   COLONOSCOPY      Prior to Admission medications   Medication Sig Start Date End Date Taking? Authorizing Provider  cholecalciferol (VITAMIN D3) 25 MCG (1000 UNIT) tablet Take 1,000 Units by mouth daily.   Yes [provider]  sertraline (ZOLOFT) 50 MG tablet Take 50 mg by mouth daily.   Yes [provider]  clonazePAM (KLONOPIN) 0.5 MG tablet Take 1 tablet by mouth 2 (two) times daily as needed. 07/29/20   [provider]  Semaglutide-Weight Management 0.25 MG/0.5ML SOAJ Inject into the skin. Patient not taking: Reported on 05/09/2022 04/09/22   [provider]    Current Outpatient Medications  Medication Sig Dispense Refill   cholecalciferol (VITAMIN D3) 25 MCG (1000 UNIT) tablet Take 1,000 Units by mouth daily.     sertraline (ZOLOFT) 50 MG tablet Take 50 mg by mouth daily.     clonazePAM (KLONOPIN) 0.5 MG tablet Take 1 tablet by mouth 2 (two) times daily as needed.     Semaglutide-Weight  Management 0.25 MG/0.5ML SOAJ Inject into the skin. (Patient not taking: Reported on 05/09/2022)     Current Facility-Administered Medications  Medication Dose Route Frequency Provider Last Rate Last Admin   0.9 %  sodium chloride infusion  500 mL Intravenous Once Nikitha Mode, Lajuan Lines, MD        Allergies as of 05/09/2022   (No Known Allergies)    Family History  Problem Relation Age of Onset   Rheum arthritis Mother    Heart disease Mother    Colon polyps Mother    Lung cancer Father    Diabetes Brother    Breast cancer Maternal Aunt    Colon cancer Neg Hx    Esophageal cancer Neg Hx    Stomach cancer Neg Hx     Social History   Socioeconomic History   Marital status: Married    Spouse name: Not on file   Number of children: 2   Years of education: Not on file   Highest education level: Not on file  Occupational History   Occupation: Production designer, theatre/television/film)    Employer: [employer]   Occupation: hairstylist  Tobacco Use   Smoking status: Never   Smokeless tobacco: Never  Vaping Use   Vaping Use: Never used  Substance and Sexual Activity   Alcohol use: Not Currently    Comment: Rarely   Drug use: Never   Sexual activity: Not on file  Other Topics Concern  Not on file  Social History Narrative   Not on file   Social Determinants of Health   Financial Resource Strain: Not on file  Food Insecurity: Not on file  Transportation Needs: Not on file  Physical Activity: Not on file  Stress: Not on file  Social Connections: Not on file  Intimate Partner Violence: Not on file    Physical Exam: Vital signs in last 24 hours: '@BP'$  (!) 146/77   Pulse 72   Temp 97.7 F (36.5 C) (Temporal)   Ht '5\' 6"'$  (1.676 m)   Wt 248 lb (112.5 kg)   LMP 06/13/2018 (Approximate)   SpO2 96%   BMI 40.03 kg/m  GEN: NAD EYE: Sclerae anicteric ENT: MMM CV: Non-tachycardic Pulm: CTA b/l GI: Soft, NT/ND NEURO:  Alert & Oriented x 3   Zenovia Jarred, MD [facility]  05/09/2022 9:30 AM

## 2022-05-12 ENCOUNTER — Telehealth: Payer: Self-pay | Admitting: *Deleted

## 2022-05-12 NOTE — Telephone Encounter (Signed)
Follow up call attempt.Left vm to call if any questions or concerns.

## 2022-05-20 ENCOUNTER — Encounter: Payer: Self-pay | Admitting: Internal Medicine

## 2022-06-20 ENCOUNTER — Ambulatory Visit (INDEPENDENT_AMBULATORY_CARE_PROVIDER_SITE_OTHER): Payer: 59

## 2022-06-20 ENCOUNTER — Ambulatory Visit: Payer: 59 | Admitting: Podiatry

## 2022-06-20 VITALS — BP 122/86

## 2022-06-20 DIAGNOSIS — M7752 Other enthesopathy of left foot: Secondary | ICD-10-CM

## 2022-06-20 MED ORDER — MELOXICAM 15 MG PO TABS: 15.0000 mg | ORAL_TABLET | Freq: Every day | ORAL | 1 refills | Status: DC

## 2022-06-20 NOTE — Progress Notes (Signed)
   Chief Complaint  Patient presents with   Foot Pain    Patient came in today for left foot and ankle pain and swelling, patient states since the last visit the ankle has gotten worse, rate of pain 8 out of 10,  throbbing ache, TX: Cam boot     Subjective:  52 y.o. female presenting today for follow-up evaluation of left ankle pain this been going on for about 4-5 months now.  She denies a history of recent injury.  She does have a history of ORIF left ankle in 1992.  She cannot recall an incident that would have elicited the pain about 4-5 months ago.  Patient has been wearing the cam boot for the last 4 weeks.  Overall improvement.  She presents for follow-up treatment and evaluation.  No new complaints at this time  Past Medical History:  Diagnosis Date   Anxiety    Arthritis    Borderline hyperlipidemia    Intermittent claudication ([place])    Obesity    Past Surgical History:  Procedure Laterality Date   CESAREAN SECTION  2010   CESAREAN SECTION  2011   CHOLECYSTECTOMY  2001   COLONOSCOPY     No Known Allergies   Objective / Physical Exam:  General:  The patient is alert and oriented x3 in no acute distress. Dermatology:  Skin is warm, dry and supple bilateral lower extremities. Negative for open lesions or macerations. Vascular:  Palpable pedal pulses bilaterally. No edema or erythema noted. Capillary refill within normal limits. Neurological:  Epicritic and protective threshold grossly intact bilaterally.  Musculoskeletal Exam:  There continues to be some slight pain on palpation to the anteromedial of the patient's left ankle. Mild edema noted. Range of motion within normal limits to all pedal and ankle joints bilateral. Muscle strength 5/5 in all groups bilateral.   Radiographic Exam LT foot and ankle 09/04/2021:  Normal osseous mineralization.  Orthopedic hardware is intact to the medial malleolus as well as the fibula.  Degenerative changes noted with joint space  narrowing and widening of the medial gutter of the tibiotalar joint  Assessment: 1. 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  DJD/capsulitis left ankle joint  Plan of Care:  1. Patient was evaluated.  Overall significant improvement. 2. Injection of 0.5 mL Celestone Soluspan injected in the patient's left ankle. 3.  Continue meloxicam 15 mg daily as needed 4.  Patient may begin to transition out of the cam boot.  Ankle brace dispensed. 5.  Return to clinic as needed  *Training for about [employer]   Edrick Kins, DPM [facility]  Dr. Edrick Kins, DPM    [address], [place] [address]                Office [phone]  Fax [phone]

## 2022-06-22 DIAGNOSIS — M7752 Other enthesopathy of left foot: Secondary | ICD-10-CM | POA: Diagnosis not present

## 2022-06-22 MED ORDER — BETAMETHASONE SOD PHOS & ACET 6 (3-3) MG/ML IJ SUSP
3.0000 mg | Freq: Once | INTRAMUSCULAR | Status: AC
  Administered 2022-06-22: 3 mg via INTRA_ARTICULAR

## 2022-09-24 ENCOUNTER — Ambulatory Visit: Payer: 59 | Admitting: Podiatry

## 2022-10-22 ENCOUNTER — Ambulatory Visit: Payer: 59 | Admitting: Podiatry

## 2022-10-22 DIAGNOSIS — M19072 Primary osteoarthritis, left ankle and foot: Secondary | ICD-10-CM | POA: Diagnosis not present

## 2022-10-22 NOTE — Progress Notes (Signed)
   Chief Complaint  Patient presents with   capsulitis    Left foot is doing good today,not on it a lot     Subjective:  52 y.o. female otherwise healthy presenting for follow-up with relation of chronic progressive left ankle pain.  She does have a history of ORIF left ankle in 1992.  Patient states that she is doing well.  The pain is somewhat tolerable for the moment.  She says that her ankle pain is dependent upon her activity.  We have discussed in the past surgical arthrodesis versus arthroplasty with implant.  She is leaning towards an arthroplasty with implant perhaps at the end of the year or after the beginning of the year.  Presenting for further treatment and evaluation  Past Medical History:  Diagnosis Date   Anxiety    Arthritis    Borderline hyperlipidemia    Intermittent claudication ([facility])    Obesity    Past Surgical History:  Procedure Laterality Date   CESAREAN SECTION  2010   CESAREAN SECTION  2011   CHOLECYSTECTOMY  2001   COLONOSCOPY     No Known Allergies Objective / Physical Exam:  General:  The patient is alert and oriented x3 in no acute distress. Dermatology:  Skin is warm, dry and supple bilateral lower extremities. Negative for open lesions or macerations. Vascular:  Palpable pedal pulses bilaterally.  Mild chronic edema noted specifically around the left ankle. Capillary refill within normal limits. Neurological:  Light touch and protective threshold grossly intact bilaterally.  Musculoskeletal Exam:  There continues to be chronic pain on palpation to the anteromedial of the patient's left ankle. Mild edema noted.  Limited range of motion with pain on palpation and range of motion to the ankle joint specifically.  Radiographic Exam LT foot and ankle 06/20/2022:  Normal osseous mineralization.  Orthopedic hardware is intact to the medial malleolus as well as the fibula.  Degenerative changes noted to the tibiotalar joint as well as the medial gutter.   Overall the tibiotalar joint and foot appears in somewhat rectus alignment compared to the leg.  No severe varus or valgus deformity.  Slight lateral displacement of the talus within the ankle mortise.   Assessment: 1. 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  Chronic advanced DJD/capsulitis left ankle joint  -Patient evaluated.   - Continue meloxicam 15 mg daily as needed -Today we will place referral for Dr. Lucita Ferrara, [facility], for surgical consult of possible ankle arthrodesis vs. arthroplasty with implant. Well known in the area for implant surgery.  -Return to clinic as needed  *Training for about [employer]   Felecia Shelling, DPM [facility]  Dr. Felecia Shelling, DPM    [address] [place], [place] [address]                Office [phone]  Fax [phone]

## 2022-10-28 ENCOUNTER — Other Ambulatory Visit: Payer: Self-pay | Admitting: Podiatry

## 2022-10-29 ENCOUNTER — Telehealth: Payer: Self-pay | Admitting: *Deleted

## 2022-10-29 NOTE — Telephone Encounter (Signed)
Referral has been updated/ faxed to Dr Lucita Ferrara

## 2022-10-29 NOTE — Telephone Encounter (Signed)
-----   Message from Felecia Shelling, DPM sent at 10/22/2022  6:45 PM EDT ----- Regarding: Referral to orthopedics I placed a referral today to Dr. Lucita Ferrara [facility]  Can we please reach out to the office to make sure that they received the referral.  Thank so much, Dr. Logan Bores

## 2023-01-07 ENCOUNTER — Other Ambulatory Visit: Payer: Self-pay | Admitting: Podiatry

## 2023-03-01 ENCOUNTER — Other Ambulatory Visit: Payer: Self-pay | Admitting: Podiatry

## 2023-05-08 ENCOUNTER — Other Ambulatory Visit: Payer: Self-pay | Admitting: Podiatry

## 2023-10-04 ENCOUNTER — Other Ambulatory Visit: Payer: Self-pay | Admitting: Podiatry

## 2023-12-09 ENCOUNTER — Other Ambulatory Visit: Payer: Self-pay | Admitting: Podiatry
